# Patient Record
Sex: Female | Born: 1950 | Race: White | Smoking: Never smoker
Health system: Southern US, Community
[De-identification: ages and names within clinical notes are randomized; demographics above are authoritative.]

## PROBLEM LIST (undated history)

## (undated) DIAGNOSIS — E785 Hyperlipidemia, unspecified: Secondary | ICD-10-CM

## (undated) DIAGNOSIS — R011 Cardiac murmur, unspecified: Secondary | ICD-10-CM

## (undated) DIAGNOSIS — I2510 Atherosclerotic heart disease of native coronary artery without angina pectoris: Secondary | ICD-10-CM

## (undated) DIAGNOSIS — I499 Cardiac arrhythmia, unspecified: Secondary | ICD-10-CM

## (undated) DIAGNOSIS — G4733 Obstructive sleep apnea (adult) (pediatric): Secondary | ICD-10-CM

## (undated) DIAGNOSIS — I10 Essential (primary) hypertension: Secondary | ICD-10-CM

## (undated) DIAGNOSIS — F419 Anxiety disorder, unspecified: Secondary | ICD-10-CM

## (undated) HISTORY — DX: Anxiety disorder, unspecified: F41.9

## (undated) HISTORY — DX: Cardiac arrhythmia, unspecified: I49.9

## (undated) HISTORY — DX: Cardiac murmur, unspecified: R01.1

## (undated) HISTORY — DX: Essential (primary) hypertension: I10

## (undated) HISTORY — DX: Obstructive sleep apnea (adult) (pediatric): G47.33

## (undated) HISTORY — PX: SPINAL FUSION: SHX223

## (undated) HISTORY — DX: Hyperlipidemia, unspecified: E78.5

## (undated) HISTORY — DX: Atherosclerotic heart disease of native coronary artery without angina pectoris: I25.10

---

## 1984-04-20 HISTORY — PX: VAGINAL HYSTERECTOMY: SUR661

## 2007-04-21 HISTORY — PX: OTHER SURGICAL HISTORY: SHX169

## 2008-04-20 HISTORY — PX: ACHILLES TENDON SURGERY: SHX542

## 2009-04-20 HISTORY — PX: ROTATOR CUFF REPAIR: SHX139

## 2019-06-30 ENCOUNTER — Other Ambulatory Visit: Payer: Self-pay

## 2019-06-30 ENCOUNTER — Ambulatory Visit: Payer: Medicare Other | Admitting: Cardiology

## 2019-06-30 ENCOUNTER — Encounter: Payer: Self-pay | Admitting: Cardiology

## 2019-06-30 VITALS — BP 150/82 | HR 76 | Temp 99.4°F | Ht 65.0 in | Wt 222.0 lb

## 2019-06-30 DIAGNOSIS — E6609 Other obesity due to excess calories: Secondary | ICD-10-CM

## 2019-06-30 DIAGNOSIS — R0609 Other forms of dyspnea: Secondary | ICD-10-CM

## 2019-06-30 DIAGNOSIS — Z6836 Body mass index (BMI) 36.0-36.9, adult: Secondary | ICD-10-CM

## 2019-06-30 DIAGNOSIS — R011 Cardiac murmur, unspecified: Secondary | ICD-10-CM | POA: Insufficient documentation

## 2019-06-30 DIAGNOSIS — M25569 Pain in unspecified knee: Secondary | ICD-10-CM | POA: Insufficient documentation

## 2019-06-30 DIAGNOSIS — I10 Essential (primary) hypertension: Secondary | ICD-10-CM | POA: Insufficient documentation

## 2019-06-30 DIAGNOSIS — E871 Hypo-osmolality and hyponatremia: Secondary | ICD-10-CM | POA: Insufficient documentation

## 2019-06-30 DIAGNOSIS — Z9660 Presence of unspecified orthopedic joint implant: Secondary | ICD-10-CM | POA: Insufficient documentation

## 2019-06-30 MED ORDER — HYDROCHLOROTHIAZIDE 12.5 MG PO CAPS: 12.5000 mg | ORAL_CAPSULE | Freq: Every day | ORAL | 1 refills | Status: DC

## 2019-06-30 NOTE — Patient Instructions (Signed)
Please remember to bring in your medication bottles in at the next visit.   New Medications that were added at today's visit:  Hydrochlorothiazide 12.5 mg p.o. every morning.  Medications that were discontinued at today's visit: None  Office will call you to have the following tests scheduled:  Echo  Please get labs done in about 1 week at the nearest [facility].  Recommend follow up with your PCP as scheduled.

## 2019-06-30 NOTE — Progress Notes (Signed)
REASON FOR CONSULT: Cardiac murmur.  Chief Complaint  Patient presents with  . Heart Murmur  . New Patient (Initial Visit)    REQUESTING PHYSICIAN:  Frances Maywood, [place] [address]  HPI  Kimberly Lamb is a 69 y.o. female who presents to the office with a chief complaint of " evaluation for heart murmur and shortness of breath." Patient's past medical history and cardiac risk factors include: Benign essential hypertension, obesity, due to excess calories, postmenopausal female, advanced age.  Patient states that she recently went to her primary care provider for evaluation and during that visit she was notified that she had a cardiac murmur.  She was asked to follow-up with cardiology for further evaluation.  Patient states that she has never had a cardiac murmur in the past according to her.  She has not seen a cardiologist in the past and she has never had an echo or stress test either.  She denies any active chest pain.  However she does have effort related dyspnea.  Patient states that he has been going on for at least 1.5 months.  If usually brought on by effort related activities.  It resolves with rest.  Unable to quantify intensity.  She denies any symptoms of heart failure which include orthopnea, paroxysmal nocturnal dyspnea or lower extremity swelling.  She also denies any lightheadedness, dizziness, near syncope or syncopal events with effort related dyspnea.  Benign essential hypertension: Patient has a history of high blood pressure and has been on multiple medications in the past is currently on lisinopril 20 mg p.o. daily.  Patient states that her home blood pressure usually ranges between 892-119 mmHg and diastolic blood pressures range between 70-75 mmHg.  Patient states that she tries to walk at least 2 miles a day in the efforts of losing weight and improving her lipid profile.  Review of systems positive for: Dyspnea on exertion, dysphagia.  Currently patient denies chest pain, lightheadedness, dizziness, palpitations, orthopnea, paroxysmal nocturnal dyspnea, lower extremity swelling, near syncope, syncopal events, hematochezia, hemoptysis, hematemesis, melanotic stools, no symptoms of amaurosis fugax, motor or sensory symptoms or dysphasia in the last 6 months.   Denies prior history of coronary artery disease, myocardial infarction, congestive heart failure, deep venous thrombosis, pulmonary embolism, stroke, transient ischemic attack.  FUNCTIONAL STATUS: She attempts to walk about 22mles a day.    ALLERGIES: Allergies  Allergen Reactions  . Oxycodone-Acetaminophen Other (See Comments)    hallucinate    MEDICATION LIST PRIOR TO VISIT: Current Outpatient Medications on File Prior to Visit  Medication Sig Dispense Refill  . citalopram (CELEXA) 40 MG tablet Take 40 mg by mouth daily.    . cyclobenzaprine (FLEXERIL) 5 MG tablet Take 5 mg by mouth 2 (two) times daily.    .Marland Kitchendocusate sodium (DOK) 100 MG capsule Take 100 mg by mouth as needed.    .Marland Kitchenlisinopril (ZESTRIL) 20 MG tablet Take 20 mg by mouth daily.    . meloxicam (MOBIC) 15 MG tablet Take 15 mg by mouth daily.    . traZODone (DESYREL) 100 MG tablet     . busPIRone (BUSPAR) 15 MG tablet Take 15 mg by mouth as needed.      No current facility-administered medications on file prior to visit.    PAST MEDICAL HISTORY: Past Medical History:  Diagnosis Date  . Anxiety   . Heart murmur   . Hypertension     PAST SURGICAL HISTORY: Past Surgical History:  Procedure Laterality Date  . ACHILLES TENDON SURGERY  2010  . knee replacement Right 2009   x3 2009, 2012, 2014  . ROTATOR CUFF REPAIR  2011   Right  . SPINAL FUSION     l3  . VAGINAL HYSTERECTOMY  1986    FAMILY HISTORY: The patient family history includes Heart attack in her father; Heart failure in her father.   SOCIAL HISTORY:  The patient  reports that she has never smoked. She has never used smokeless  tobacco. She reports that she does not drink alcohol or use drugs.  14 ORGAN REVIEW OF SYSTEMS: CONSTITUTIONAL: No fever or significant weight loss EYES: No recent significant visual change EARS, NOSE, MOUTH, THROAT: No recent significant change in hearing CARDIOVASCULAR: See discussion in subjective/HPI RESPIRATORY: See discussion in subjective/HPI GASTROINTESTINAL: No recent complaints of abdominal pain GENITOURINARY: No recent significant change in genitourinary status MUSCULOSKELETAL: No recent significant change in musculoskeletal status INTEGUMENTARY: No recent rash NEUROLOGIC: No recent significant change in motor function PSYCHIATRIC: No recent significant change in mood ENDOCRINOLOGIC: No recent significant change in endocrine status HEMATOLOGIC/LYMPHATIC: No recent significant unexpected bruising ALLERGIC/IMMUNOLOGIC: No recent unexplained allergic reaction  PHYSICAL EXAM: Vitals with BMI 06/30/2019 06/30/2019  Height - '5\' 5"'$   Weight - 222 lbs  BMI - 71.16  Systolic 579 038  Diastolic 82 89  Pulse 76 81    CONSTITUTIONAL: Well-developed and well-nourished. No acute distress.  SKIN: Skin is warm and dry. No rash noted. No cyanosis. No pallor. No jaundice HEAD: Normocephalic and atraumatic.  EYES: No scleral icterus MOUTH/THROAT: Moist oral membranes.  NECK: No JVD present. No thyromegaly noted. No carotid bruits  LYMPHATIC: No visible cervical adenopathy.  CHEST Normal respiratory effort. No intercostal retractions  LUNGS: Clear to auscultation bilaterally.  No stridor. No wheezes. No rales.  CARDIOVASCULAR: Regular rate and rhythm, positive B3-X8, soft systolic ejection murmur, no gallops or rubs. ABDOMINAL: Soft, nontender, nondistended, positive bowel sounds, no apparent ascites.  EXTREMITIES: No peripheral edema, warm to touch, 2+ dorsalis pedis and posterior tibial pulses bilaterally. HEMATOLOGIC: No significant bruising NEUROLOGIC: Oriented to person, place, and  time. Nonfocal. Normal muscle tone.  PSYCHIATRIC: Normal mood and affect. Normal behavior. Cooperative  CARDIAC DATABASE: EKG: 06/30/2019: Normal sinus rhythm with ventricular rate of 73 bpm, poor R wave progression, nonspecific T wave abnormality, without evidence of underlying injury pattern.  No prior EKGs available for comparison.  Echocardiogram: None  Stress Testing:  None  Heart Catheterization: None  LABORATORY DATA: External Labs: Collected:  Creatinine 0.77 mg/dL. eGFR: Greater than 60 mL/min per 1.73 m Lipid profile: Total cholesterol 208, triglycerides 102, HDL 59, LDL 129 TSH: 1.23   FINAL MEDICATION LIST END OF ENCOUNTER: Meds ordered this encounter  Medications  . hydrochlorothiazide (MICROZIDE) 12.5 MG capsule    Sig: Take 1 capsule (12.5 mg total) by mouth daily.    Dispense:  90 capsule    Refill:  1    Medications Discontinued During This Encounter  Medication Reason  . esomeprazole (NEXIUM) 20 MG capsule Error  . HYDROcodone-acetaminophen (NORCO/VICODIN) 5-325 MG tablet Error  . rivaroxaban (XARELTO) 10 MG TABS tablet Error     Current Outpatient Medications:  .  citalopram (CELEXA) 40 MG tablet, Take 40 mg by mouth daily., Disp: , Rfl:  .  cyclobenzaprine (FLEXERIL) 5 MG tablet, Take 5 mg by mouth 2 (two) times daily., Disp: , Rfl:  .  docusate sodium (DOK) 100 MG capsule, Take 100 mg by mouth as needed., Disp: ,  Rfl:  .  lisinopril (ZESTRIL) 20 MG tablet, Take 20 mg by mouth daily., Disp: , Rfl:  .  meloxicam (MOBIC) 15 MG tablet, Take 15 mg by mouth daily., Disp: , Rfl:  .  traZODone (DESYREL) 100 MG tablet, , Disp: , Rfl:  .  busPIRone (BUSPAR) 15 MG tablet, Take 15 mg by mouth as needed. , Disp: , Rfl:  .  hydrochlorothiazide (MICROZIDE) 12.5 MG capsule, Take 1 capsule (12.5 mg total) by mouth daily., Disp: 90 capsule, Rfl: 1  IMPRESSION:    ICD-10-CM   1. Murmur, cardiac  R01.1 EKG 12-Lead    PCV ECHOCARDIOGRAM COMPLETE  2. Benign  hypertension  I10 hydrochlorothiazide (MICROZIDE) 12.5 MG capsule    Basic metabolic panel    Magnesium    Magnesium    Basic metabolic panel  3. Class 2 obesity due to excess calories without serious comorbidity with body mass index (BMI) of 36.0 to 36.9 in adult  E66.09    Z68.36   4. Dyspnea on exertion  R06.00 hydrochlorothiazide (MICROZIDE) 12.5 MG capsule    Basic metabolic panel    Magnesium    Magnesium    Basic metabolic panel     RECOMMENDATIONS: Kimberly Lamb is a 69 y.o. female whose past medical history and cardiac risk factors include: Benign essential hypertension, obesity, due to excess calories, postmenopausal female, advanced age.  Cardiac murmur:  There appears to be a grade 2-3 out of 6 systolic murmur heard at the second intercostal space.  Plan echocardiogram to evaluate for structural heart disease and LV function.  Benign essential hypertension: . Office blood pressure is not at goal.  . Medication reconciled.  . Add hydrochlorothiazide 12.5 mg p.o. every morning. . Will check renal function and electrolytes 1 week after initiating diuretic therapy. . If the blood pressure is consistently greater than 138mHg patient is asked to call the office to for medication titration sooner than the next office visit.  Low salt diet recommended. A diet that is rich in fruits, vegetables, legumes, and low-fat dairy products and low in snacks, sweets, and meats (such as the Dietary Approaches to Stop Hypertension [DASH] diet).   Mixed hyperlipidemia:  Patient is currently working with her primary provider with lifestyle modifications to improve her overall lipid panel.  Patient is estimated 10-year risk of ASCVD risk is greater than 10%.  We discussed initiating statin therapy if there is not significant improvement in her lipid profile at the next check with your primary provider.  Patient is agreeable.  Obesity, due to excess calories  Body mass index is 36.94  kg/m.  I reviewed with the patient the importance of diet, regular physical activity/exercise, weight loss.    Patient is educated on increasing physical activity gradually as tolerated.  With the goal of moderate intensity exercise for 30 minutes a day 5 days a week.   Orders Placed This Encounter  Procedures  . Basic metabolic panel  . Magnesium  . EKG 12-Lead  . PCV ECHOCARDIOGRAM COMPLETE   --Continue cardiac medications as reconciled in final medication list. --Return in about 6 weeks (around 08/11/2019) for Discussion of test results re: murmur, HTN, meds . Or sooner if needed. --Continue follow-up with your primary care physician regarding the management of your other chronic comorbid conditions.  Patient's questions and concerns were addressed to her satisfaction. She voices understanding of the instructions provided during this encounter.   This note was created using a voice recognition software as a result there  may be grammatical errors inadvertently enclosed that do not reflect the nature of this encounter. Every attempt is made to correct such errors.  Rex Kras, DO, [facility]. [place] Office: [phone]

## 2019-07-06 ENCOUNTER — Ambulatory Visit: Payer: Medicare Other

## 2019-07-06 ENCOUNTER — Other Ambulatory Visit: Payer: Self-pay

## 2019-07-06 DIAGNOSIS — R011 Cardiac murmur, unspecified: Secondary | ICD-10-CM

## 2019-07-08 LAB — BASIC METABOLIC PANEL
BUN/Creatinine Ratio: 30 — ABNORMAL HIGH (ref 12–28)
BUN: 20 mg/dL (ref 8–27)
CO2: 23 mmol/L (ref 20–29)
Calcium: 9.6 mg/dL (ref 8.7–10.3)
Chloride: 102 mmol/L (ref 96–106)
Creatinine, Ser: 0.66 mg/dL (ref 0.57–1.00)
GFR calc Af Amer: 103 mL/min/{1.73_m2} (ref >59)
GFR calc non Af Amer: 89 mL/min/{1.73_m2} (ref >59)
Glucose: 94 mg/dL (ref 65–99)
Potassium: 4.6 mmol/L (ref 3.5–5.2)
Sodium: 139 mmol/L (ref 134–144)

## 2019-07-08 LAB — MAGNESIUM: Magnesium: 2.2 mg/dL (ref 1.6–2.3)

## 2019-07-10 ENCOUNTER — Telehealth: Payer: Self-pay

## 2019-07-11 NOTE — Telephone Encounter (Signed)
Have her increase her lisinopril to 40 mg p.o. every afternoon.Increase hydrochlorothiazide to 25 mg p.o. every morning.Blood work in 1 week check BMP and magnesium level. Please update her MAR and put in lab work.

## 2019-07-12 ENCOUNTER — Other Ambulatory Visit: Payer: Self-pay

## 2019-07-12 DIAGNOSIS — I10 Essential (primary) hypertension: Secondary | ICD-10-CM

## 2019-07-12 NOTE — Telephone Encounter (Signed)
Patient advised to increase Lisinopril to 40mg  p.o. every afternoon and increase Hydrochlorothiazide to 25mg  p.o. every morning, and also to go have blood drawn in a week. She showed understanding and stated that she does not need any refills at this time, because she has enough to last her until then, even with the increase. I will document change in patient chart.

## 2019-07-12 NOTE — Telephone Encounter (Signed)
Were you able to upadate his Med list and or place those labs.

## 2019-07-12 NOTE — Telephone Encounter (Signed)
I already made the changes in patients medication list.

## 2019-07-13 NOTE — Telephone Encounter (Signed)
Okay 

## 2019-07-20 LAB — BASIC METABOLIC PANEL
BUN/Creatinine Ratio: 19 (ref 12–28)
BUN: 16 mg/dL (ref 8–27)
CO2: 22 mmol/L (ref 20–29)
Calcium: 9.6 mg/dL (ref 8.7–10.3)
Chloride: 103 mmol/L (ref 96–106)
Creatinine, Ser: 0.84 mg/dL (ref 0.57–1.00)
GFR calc Af Amer: 81 mL/min/{1.73_m2} (ref >59)
GFR calc non Af Amer: 70 mL/min/{1.73_m2} (ref >59)
Glucose: 94 mg/dL (ref 65–99)
Potassium: 4.1 mmol/L (ref 3.5–5.2)
Sodium: 142 mmol/L (ref 134–144)

## 2019-07-28 ENCOUNTER — Ambulatory Visit: Payer: Medicare Other | Admitting: Cardiology

## 2019-07-28 ENCOUNTER — Other Ambulatory Visit: Payer: Self-pay

## 2019-07-28 ENCOUNTER — Encounter: Payer: Self-pay | Admitting: Cardiology

## 2019-07-28 VITALS — BP 136/65 | HR 75 | Temp 98.3°F | Resp 15 | Ht 66.0 in | Wt 224.0 lb

## 2019-07-28 DIAGNOSIS — E6609 Other obesity due to excess calories: Secondary | ICD-10-CM

## 2019-07-28 DIAGNOSIS — Z6836 Body mass index (BMI) 36.0-36.9, adult: Secondary | ICD-10-CM

## 2019-07-28 DIAGNOSIS — I10 Essential (primary) hypertension: Secondary | ICD-10-CM

## 2019-07-28 DIAGNOSIS — R0609 Other forms of dyspnea: Secondary | ICD-10-CM

## 2019-07-28 MED ORDER — AMLODIPINE BESYLATE 5 MG PO TABS: 5.0000 mg | ORAL_TABLET | Freq: Every morning | ORAL | 1 refills | Status: DC

## 2019-07-28 MED ORDER — LISINOPRIL 40 MG PO TABS: 40.0000 mg | ORAL_TABLET | Freq: Every day | ORAL | 1 refills | Status: DC

## 2019-07-28 MED ORDER — HYDROCHLOROTHIAZIDE 25 MG PO TABS: 25.0000 mg | ORAL_TABLET | Freq: Every morning | ORAL | 1 refills | Status: DC

## 2019-07-28 NOTE — Progress Notes (Signed)
Kimberly Lamb Date of Birth: 08/07/1950 MRN: [id number] Primary Care Provider:[place], [place], FNP  Date: 07/28/19 Last Office Visit: 06/30/2019.  Chief Complaint  Patient presents with  . Follow-up    6 week    HPI  Kimberly Lamb is a 69 y.o. female who presents to the office with a chief complaint of " evaluation for heart murmur and shortness of breath." Patient's past medical history and cardiac risk factors include: Benign essential hypertension, obesity, due to excess calories, postmenopausal female, advanced age.  Patient was initially seen in consultation back in March 2021 at the request of her primary care provider for possible evaluation of a cardiac murmur and irregularly irregular heartbeat.  Since last office visit patient states that she is doing better from a cardiovascular standpoint.    At the last office visit patient was mentioning she has effort related dyspnea that was ongoing for at least 1-1/2 months prior to the original consultation.  Patient states that she feels well with exertion; however, her girlfriends who she goes out for a walk with do mention that she is more short of breath.  Patient states that she tries to walk at least 9500-10,000 steps per day.  She still has shortness of breath but it is not increasing in intensity frequency and/or duration.  Given her symptoms of effort related dyspnea and elevated blood pressures at the last office visit I initiated hydrochlorothiazide 12.5 mg p.o. daily.  Patient called back in the interim stating that her blood pressures continue to be elevated and at that time her lisinopril was increased to 40 mg p.o. daily and HCTZ was increased to 25 mg p.o. daily.  She had blood work 1 week after which notes kidney function and potassium to be at baseline.  Patient brings her blood pressure log in for review and her blood pressures are still not at goal.  They are improving in general.  She no longer has readings as high as 190  mmHg.  Her systolic blood pressures do range between 113--154 mmHg.    Since the last office visit patient has undergone an echocardiogram which reports a preserved left ventricular systolic function indeterminate diastolic filling pattern and mild valvular heart disease.  These findings were discussed with the patient at today's office visit and noted below for further reference.  Review of systems positive for: Dyspnea on exertion. Currently patient denies chest pain, lightheadedness, dizziness, palpitations, orthopnea, paroxysmal nocturnal dyspnea, lower extremity swelling, near syncope, syncopal events, hematochezia, hemoptysis, hematemesis, melanotic stools, no symptoms of amaurosis fugax, motor or sensory symptoms or dysphasia in the last 6 months.   Denies prior history of coronary artery disease, myocardial infarction, congestive heart failure, deep venous thrombosis, pulmonary embolism, stroke, transient ischemic attack.  FUNCTIONAL STATUS: She walks approximately 9,500-10000 steps per day..    ALLERGIES: No Active Allergies MEDICATION LIST PRIOR TO VISIT: Current Outpatient Medications on File Prior to Visit  Medication Sig Dispense Refill  . busPIRone (BUSPAR) 15 MG tablet Take 15 mg by mouth 2 (two) times daily.     . citalopram (CELEXA) 40 MG tablet Take 40 mg by mouth daily.    . cyclobenzaprine (FLEXERIL) 5 MG tablet Take 5 mg by mouth 2 (two) times daily.    Marland Kitchen docusate sodium (DOK) 100 MG capsule Take 100 mg by mouth as needed.    . meloxicam (MOBIC) 15 MG tablet Take 15 mg by mouth daily.    . traZODone (DESYREL) 100 MG tablet  No current facility-administered medications on file prior to visit.    PAST MEDICAL HISTORY: Past Medical History:  Diagnosis Date  . Anxiety   . Heart murmur   . Hypertension     PAST SURGICAL HISTORY: Past Surgical History:  Procedure Laterality Date  . ACHILLES TENDON SURGERY  2010  . knee replacement Right 2009   x3 2009, 2012,  2014  . ROTATOR CUFF REPAIR  2011   Right  . SPINAL FUSION     l3  . VAGINAL HYSTERECTOMY  1986    FAMILY HISTORY: The patient family history includes Heart attack in her father; Heart failure in her father.   SOCIAL HISTORY:  The patient  reports that she has never smoked. She has never used smokeless tobacco. She reports that she does not drink alcohol or use drugs.  14 ORGAN REVIEW OF SYSTEMS: CONSTITUTIONAL: No fever or significant weight loss EYES: No recent significant visual change EARS, NOSE, MOUTH, THROAT: No recent significant change in hearing CARDIOVASCULAR: See discussion in subjective/HPI RESPIRATORY: See discussion in subjective/HPI GASTROINTESTINAL: No recent complaints of abdominal pain GENITOURINARY: No recent significant change in genitourinary status MUSCULOSKELETAL: No recent significant change in musculoskeletal status INTEGUMENTARY: No recent rash NEUROLOGIC: No recent significant change in motor function PSYCHIATRIC: No recent significant change in mood ENDOCRINOLOGIC: No recent significant change in endocrine status HEMATOLOGIC/LYMPHATIC: No recent significant unexpected bruising ALLERGIC/IMMUNOLOGIC: No recent unexplained allergic reaction  PHYSICAL EXAM: Vitals with BMI 07/28/2019 07/28/2019 06/30/2019  Height - _0  -  Weight - 224 lbs -  BMI - 53.74 -  Systolic 827 078 675  Diastolic 65 65 82  Pulse - 75 76    CONSTITUTIONAL: Well-developed and well-nourished. No acute distress.  SKIN: Skin is warm and dry. No rash noted. No cyanosis. No pallor. No jaundice HEAD: Normocephalic and atraumatic.  EYES: No scleral icterus MOUTH/THROAT: Moist oral membranes.  NECK: No JVD present. No thyromegaly noted. No carotid bruits  LYMPHATIC: No visible cervical adenopathy.  CHEST Normal respiratory effort. No intercostal retractions  LUNGS: Clear to auscultation bilaterally.  No stridor. No wheezes. No rales.  CARDIOVASCULAR: Regular rate and rhythm,  positive Q4-B2, soft systolic ejection murmur, no gallops or rubs. ABDOMINAL: Soft, nontender, nondistended, positive bowel sounds, no apparent ascites.  EXTREMITIES: No peripheral edema, warm to touch, 2+ dorsalis pedis and posterior tibial pulses bilaterally. HEMATOLOGIC: No significant bruising NEUROLOGIC: Oriented to person, place, and time. Nonfocal. Normal muscle tone.  PSYCHIATRIC: Normal mood and affect. Normal behavior. Cooperative  CARDIAC DATABASE: EKG: 06/30/2019: Normal sinus rhythm with ventricular rate of 73 bpm, poor R wave progression, nonspecific T wave abnormality, without evidence of underlying injury pattern.  No prior EKGs available for comparison.  Echocardiogram: 07/06/2019: LVEF 55-60%, moderate left ventricular hypertrophy, indeterminate diastolic filling pattern, mild to moderately dilated left atrium, mild MAC, mild MR, mild TR.  Stress Testing:  None  Heart Catheterization: None  LABORATORY DATA: External Labs: Collected:  Creatinine 0.77 mg/dL. eGFR: Greater than 60 mL/min per 1.73 m Lipid profile: Total cholesterol 208, triglycerides 102, HDL 59, LDL 129 TSH: 1.23   FINAL MEDICATION LIST END OF ENCOUNTER: Meds ordered this encounter  Medications  . amLODipine (NORVASC) 5 MG tablet    Sig: Take 1 tablet (5 mg total) by mouth in the morning.    Dispense:  90 tablet    Refill:  1  . lisinopril (ZESTRIL) 40 MG tablet    Sig: Take 1 tablet (40 mg total) by mouth daily.  Dispense:  90 tablet    Refill:  1  . hydrochlorothiazide (HYDRODIURIL) 25 MG tablet    Sig: Take 1 tablet (25 mg total) by mouth in the morning.    Dispense:  90 tablet    Refill:  1    Medications Discontinued During This Encounter  Medication Reason  . hydrochlorothiazide (HYDRODIURIL) 25 MG tablet Reorder  . lisinopril (ZESTRIL) 40 MG tablet Reorder     Current Outpatient Medications:  .  busPIRone (BUSPAR) 15 MG tablet, Take 15 mg by mouth 2 (two) times daily. , Disp:  , Rfl:  .  citalopram (CELEXA) 40 MG tablet, Take 40 mg by mouth daily., Disp: , Rfl:  .  cyclobenzaprine (FLEXERIL) 5 MG tablet, Take 5 mg by mouth 2 (two) times daily., Disp: , Rfl:  .  docusate sodium (DOK) 100 MG capsule, Take 100 mg by mouth as needed., Disp: , Rfl:  .  hydrochlorothiazide (HYDRODIURIL) 25 MG tablet, Take 1 tablet (25 mg total) by mouth in the morning., Disp: 90 tablet, Rfl: 1 .  lisinopril (ZESTRIL) 40 MG tablet, Take 1 tablet (40 mg total) by mouth daily., Disp: 90 tablet, Rfl: 1 .  meloxicam (MOBIC) 15 MG tablet, Take 15 mg by mouth daily., Disp: , Rfl:  .  traZODone (DESYREL) 100 MG tablet, , Disp: , Rfl:  .  amLODipine (NORVASC) 5 MG tablet, Take 1 tablet (5 mg total) by mouth in the morning., Disp: 90 tablet, Rfl: 1  IMPRESSION:    ICD-10-CM   1. Dyspnea on exertion  R06.00 amLODipine (NORVASC) 5 MG tablet    HOLTER MONITOR - 48 HOUR    lisinopril (ZESTRIL) 40 MG tablet    hydrochlorothiazide (HYDRODIURIL) 25 MG tablet    PCV MYOCARDIAL PERFUSION WITH LEXISCAN  2. Benign hypertension  I10 amLODipine (NORVASC) 5 MG tablet    lisinopril (ZESTRIL) 40 MG tablet    hydrochlorothiazide (HYDRODIURIL) 25 MG tablet  3. Class 2 obesity due to excess calories without serious comorbidity with body mass index (BMI) of 36.0 to 36.9 in adult  E66.09    Z68.36      RECOMMENDATIONS: Kimberly Lamb is a 69 y.o. female whose past medical history and cardiac risk factors include: Benign essential hypertension, obesity, due to excess calories, postmenopausal female, advanced age.  Dyspnea on exertion:  Initially thought that a component of her effort related dyspnea was secondary to elevated blood pressures.  However, despite improving blood pressures she continues to have shortness of breath.  She has multiple cardiovascular risk factors I would recommend an ischemic evaluation.  Nuclear stress test recommended to evaluate for reversible ischemia.  Echocardiogram results  reviewed with the patient.  She is noted to have a preserved left ventricular systolic function.  But due to dilated left atrium would like to recommend a 48-hour Holter to possibly evaluate for underlying atrial fibrillation and/or arrhythmic burden.  Benign essential hypertension: . Medication reconciled.  . Add Norvasc 5 mg p.o. every morning. Marland Kitchen Refill hydrochlorothiazide and lisinopril. . Patient was recommended to enroll into principal care management for better blood pressure monitoring.  However, patient would like to hold off on it and may consider at the next office visit. . If the blood pressure is consistently greater than 112mHg patient is asked to call the office to for medication titration sooner than the next office visit.  . Low salt diet recommended. A diet that is rich in fruits, vegetables, legumes, and low-fat dairy products and low in snacks,  sweets, and meats (such as the Dietary Approaches to Stop Hypertension [DASH] diet).   Mixed hyperlipidemia:  Patient is currently working with her primary provider with lifestyle modifications to improve her overall lipid panel.  Patient is estimated 10-year risk of ASCVD risk is greater than 10%.  We discussed initiating statin therapy if there is not significant improvement in her lipid profile at the next check with your primary provider.  Patient is agreeable.  Obesity, due to excess calories Body mass index is 36.15 kg/m.  I reviewed with the patient the importance of diet, regular physical activity/exercise, weight loss.    Patient is educated on increasing physical activity gradually as tolerated.  With the goal of moderate intensity exercise for 30 minutes a day 5 days a week.   Orders Placed This Encounter  Procedures  . HOLTER MONITOR - 48 HOUR  . PCV MYOCARDIAL PERFUSION WITH LEXISCAN   --Continue cardiac medications as reconciled in final medication list. --Return in about 6 weeks (around 09/08/2019). Or sooner if  needed. --Continue follow-up with your primary care physician regarding the management of your other chronic comorbid conditions.  Patient's questions and concerns were addressed to her satisfaction. She voices understanding of the instructions provided during this encounter.   This note was created using a voice recognition software as a result there may be grammatical errors inadvertently enclosed that do not reflect the nature of this encounter. Every attempt is made to correct such errors.  Rex Kras, DO, [facility]. [place] Office: [phone]

## 2019-08-09 ENCOUNTER — Other Ambulatory Visit: Payer: Self-pay

## 2019-08-09 ENCOUNTER — Ambulatory Visit: Payer: Medicare Other

## 2019-08-09 DIAGNOSIS — R0609 Other forms of dyspnea: Secondary | ICD-10-CM

## 2019-08-15 ENCOUNTER — Telehealth: Payer: Self-pay

## 2019-08-15 NOTE — Telephone Encounter (Signed)
Patient has follow up appt tomorrow on 4/28.

## 2019-08-15 NOTE — Telephone Encounter (Signed)
-----   Message from [place], [place] sent at 08/13/2019 10:55 PM EDT ----- Please have her come in this week to review her stress test results.  If she continue to have symptoms that have increased in intensity, frequency, duration or new symptoms suggestive of typical chest pain as discussed during last office visit please still seek medical attention at the closest ER via EMS.

## 2019-08-15 NOTE — Telephone Encounter (Signed)
-----   Message from Sunit Tolia, DO sent at 08/13/2019 10:55 PM EDT ----- Please have her come in this week to review her stress test results.  If she continue to have symptoms that have increased in intensity, frequency, duration or new symptoms suggestive of typical chest pain as discussed during last office visit please still seek medical attention at the closest ER via EMS. 

## 2019-08-16 ENCOUNTER — Ambulatory Visit: Payer: Medicare Other | Admitting: Cardiology

## 2019-08-16 ENCOUNTER — Encounter: Payer: Self-pay | Admitting: Cardiology

## 2019-08-16 ENCOUNTER — Other Ambulatory Visit: Payer: Self-pay

## 2019-08-16 VITALS — BP 139/73 | HR 70 | Temp 98.7°F | Ht 66.0 in | Wt 216.0 lb

## 2019-08-16 DIAGNOSIS — I10 Essential (primary) hypertension: Secondary | ICD-10-CM

## 2019-08-16 DIAGNOSIS — E6609 Other obesity due to excess calories: Secondary | ICD-10-CM

## 2019-08-16 DIAGNOSIS — Z6836 Body mass index (BMI) 36.0-36.9, adult: Secondary | ICD-10-CM

## 2019-08-16 DIAGNOSIS — R9430 Abnormal result of cardiovascular function study, unspecified: Secondary | ICD-10-CM

## 2019-08-16 DIAGNOSIS — R0609 Other forms of dyspnea: Secondary | ICD-10-CM

## 2019-08-16 MED ORDER — NITROGLYCERIN 0.4 MG SL SUBL: 0.4000 mg | SUBLINGUAL_TABLET | SUBLINGUAL | 3 refills | Status: DC | PRN

## 2019-08-16 MED ORDER — METOPROLOL TARTRATE 25 MG PO TABS: 25.0000 mg | ORAL_TABLET | Freq: Two times a day (BID) | ORAL | 0 refills | Status: DC

## 2019-08-16 NOTE — Progress Notes (Signed)
Kimberly Lamb Date of Birth: Jul 13, 1950 MRN: [id number] Primary Care Provider:[place], Council Mechanic, FNP Primary Cardiologist: Rex Kras, DO (established care 06/30/2019)  Date: 08/16/19 Last Office Visit: 07/28/2019  Chief Complaint  Patient presents with  . Shortness of Breath    follow up  . Hypertension   HPI  Kimberly Lamb is a 70 y.o. female who presents to the office with a chief complaint of " shortness of breath and hypertension." Patient's past medical history and cardiac risk factors include: Benign essential hypertension, obesity, due to excess calories, postmenopausal female, advanced age.  Patient was initially seen in consultation back in March 2021 at the request of her primary care provider for possible evaluation of a cardiac murmur and irregularly irregular heartbeat.    Initially patient's shortness of breath was most likely secondary to uncontrolled hypertension.  Her antibiotics medications have been uptitrated and her systolic blood pressures have improved significantly since last office visit.  Her blood pressure log was reviewed at today's office visit and her systolic blood pressures are consistently less than 130 mmHg.  She tolerated the addition of amlodipine 5 mg every morning well.  At the last office visit since patient was continued to have effort related dyspnea despite improvement in her blood pressures we decided to pursue an ischemic evaluation.  Patient underwent a nuclear stress test which was reported to be abnormal with perfusion defect suggestive of reversible ischemia in the LCx distribution.  I independently reviewed the nuclear stress test with both the patient and her daughter at today's office visit.  We decided to pursue additional work-up given her continued symptoms of effort related dyspnea.  We discussed cardiac CTA with FFR versus left heart catheterization.  Patient would like to proceed with cardiac CTA FFR for further evaluation of obstructive  coronary disease.  Of note patient has modified her functional capacity to avoid symptoms of effort related dyspnea.  ALLERGIES: No Active Allergies MEDICATION LIST PRIOR TO VISIT: Current Outpatient Medications on File Prior to Visit  Medication Sig Dispense Refill  . amLODipine (NORVASC) 5 MG tablet Take 1 tablet (5 mg total) by mouth in the morning. 90 tablet 1  . citalopram (CELEXA) 40 MG tablet Take 40 mg by mouth daily.    . cyclobenzaprine (FLEXERIL) 5 MG tablet Take 5 mg by mouth 2 (two) times daily.    Marland Kitchen docusate sodium (DOK) 100 MG capsule Take 100 mg by mouth as needed.    . hydrochlorothiazide (HYDRODIURIL) 25 MG tablet Take 1 tablet (25 mg total) by mouth in the morning. 90 tablet 1  . lisinopril (ZESTRIL) 40 MG tablet Take 1 tablet (40 mg total) by mouth daily. 90 tablet 1  . meloxicam (MOBIC) 15 MG tablet Take 15 mg by mouth daily.    . traZODone (DESYREL) 100 MG tablet      No current facility-administered medications on file prior to visit.    PAST MEDICAL HISTORY: Past Medical History:  Diagnosis Date  . Anxiety   . Heart murmur   . Hypertension     PAST SURGICAL HISTORY: Past Surgical History:  Procedure Laterality Date  . ACHILLES TENDON SURGERY  2010  . knee replacement Right 2009   x3 2009, 2012, 2014  . ROTATOR CUFF REPAIR  2011   Right  . SPINAL FUSION     l3  . VAGINAL HYSTERECTOMY  1986    FAMILY HISTORY: The patient family history includes Heart attack in her father; Heart failure in her father.  SOCIAL HISTORY:  The patient  reports that she has never smoked. She has never used smokeless tobacco. She reports that she does not drink alcohol or use drugs.  Review of Systems  Constitution: Negative for chills and fever.  HENT: Negative for hoarse voice and nosebleeds.   Eyes: Negative for discharge, double vision and pain.  Cardiovascular: Positive for dyspnea on exertion. Negative for chest pain, claudication, leg swelling, near-syncope,  orthopnea, palpitations, paroxysmal nocturnal dyspnea and syncope.  Respiratory: Negative for hemoptysis and shortness of breath.   Musculoskeletal: Negative for muscle cramps and myalgias.  Gastrointestinal: Negative for abdominal pain, constipation, diarrhea, hematemesis, hematochezia, melena, nausea and vomiting.  Neurological: Negative for dizziness and light-headedness.   PHYSICAL EXAM: Vitals with BMI 08/16/2019 07/28/2019 07/28/2019  Height _0  - _1   Weight 216 lbs - 224 lbs  BMI 55.73 - 22.02  Systolic 542 706 237  Diastolic 73 65 65  Pulse 70 - 75   CONSTITUTIONAL: Well-developed and well-nourished. No acute distress.  SKIN: Skin is warm and dry. No rash noted. No cyanosis. No pallor. No jaundice HEAD: Normocephalic and atraumatic.  EYES: No scleral icterus MOUTH/THROAT: Moist oral membranes.  NECK: No JVD present. No thyromegaly noted. No carotid bruits  LYMPHATIC: No visible cervical adenopathy.  CHEST Normal respiratory effort. No intercostal retractions  LUNGS: Clear to auscultation bilaterally.  No stridor. No wheezes. No rales.  CARDIOVASCULAR: Regular rate and rhythm, positive S2-G3, soft systolic ejection murmur, no gallops or rubs. ABDOMINAL: Soft, nontender, nondistended, positive bowel sounds, no apparent ascites.  EXTREMITIES: No peripheral edema, warm to touch, 2+ dorsalis pedis and posterior tibial pulses bilaterally. HEMATOLOGIC: No significant bruising NEUROLOGIC: Oriented to person, place, and time. Nonfocal. Normal muscle tone.  PSYCHIATRIC: Normal mood and affect. Normal behavior. Cooperative  CARDIAC DATABASE: EKG: 06/30/2019: Normal sinus rhythm with ventricular rate of 73 bpm, poor R wave progression, nonspecific T wave abnormality, without evidence of underlying injury pattern.  No prior EKGs available for comparison.  Echocardiogram: 07/06/2019: LVEF 55-60%, moderate left ventricular hypertrophy, indeterminate diastolic filling pattern, mild to  moderately dilated left atrium, mild MAC, mild MR, mild TR.  Stress Testing:  Carlton Adam (Walking with mod Bruce)Tetrofosmin Stress Test  08/09/2019: Mild degree Moderate extent perfusion defect consistent with mild (reversible) ischemia located in the mid to distal lateral wall (Left Circumflex Artery region) of left ventricle. Overall LV systolic function is normal without regional wall motion abnormalities. Stress LV EF: 75%. Low risk.  Heart Catheterization: None  LABORATORY DATA: External Labs: Collected:  Creatinine 0.77 mg/dL. eGFR: Greater than 60 mL/min per 1.73 m Lipid profile: Total cholesterol 208, triglycerides 102, HDL 59, LDL 129 TSH: 1.23   FINAL MEDICATION LIST END OF ENCOUNTER: Meds ordered this encounter  Medications  . metoprolol tartrate (LOPRESSOR) 25 MG tablet    Sig: Take 1 tablet (25 mg total) by mouth 2 (two) times daily for 8 days.    Dispense:  8 tablet    Refill:  0  . nitroGLYCERIN (NITROSTAT) 0.4 MG SL tablet    Sig: Place 1 tablet (0.4 mg total) under the tongue every 5 (five) minutes as needed for chest pain.    Dispense:  90 tablet    Refill:  3    Medications Discontinued During This Encounter  Medication Reason  . busPIRone (BUSPAR) 15 MG tablet Patient Discharge     Current Outpatient Medications:  .  amLODipine (NORVASC) 5 MG tablet, Take 1 tablet (5 mg total) by mouth in the  morning., Disp: 90 tablet, Rfl: 1 .  citalopram (CELEXA) 40 MG tablet, Take 40 mg by mouth daily., Disp: , Rfl:  .  cyclobenzaprine (FLEXERIL) 5 MG tablet, Take 5 mg by mouth 2 (two) times daily., Disp: , Rfl:  .  docusate sodium (DOK) 100 MG capsule, Take 100 mg by mouth as needed., Disp: , Rfl:  .  hydrochlorothiazide (HYDRODIURIL) 25 MG tablet, Take 1 tablet (25 mg total) by mouth in the morning., Disp: 90 tablet, Rfl: 1 .  lisinopril (ZESTRIL) 40 MG tablet, Take 1 tablet (40 mg total) by mouth daily., Disp: 90 tablet, Rfl: 1 .  meloxicam (MOBIC) 15 MG tablet, Take  15 mg by mouth daily., Disp: , Rfl:  .  traZODone (DESYREL) 100 MG tablet, , Disp: , Rfl:  .  metoprolol tartrate (LOPRESSOR) 25 MG tablet, Take 1 tablet (25 mg total) by mouth 2 (two) times daily for 8 days., Disp: 8 tablet, Rfl: 0 .  nitroGLYCERIN (NITROSTAT) 0.4 MG SL tablet, Place 1 tablet (0.4 mg total) under the tongue every 5 (five) minutes as needed for chest pain., Disp: 90 tablet, Rfl: 3  IMPRESSION:    ICD-10-CM   1. Abnormal nuclear stress test  R94.39 CT CORONARY MORPH W/CTA COR W/SCORE W/CA W/CM &/OR WO/CM    CT CORONARY FRACTIONAL FLOW RESERVE DATA PREP    CT CORONARY FRACTIONAL FLOW RESERVE FLUID ANALYSIS    metoprolol tartrate (LOPRESSOR) 25 MG tablet    nitroGLYCERIN (NITROSTAT) 0.4 MG SL tablet  2. Dyspnea on exertion  R06.00 CT CORONARY MORPH W/CTA COR W/SCORE W/CA W/CM &/OR WO/CM    CT CORONARY FRACTIONAL FLOW RESERVE DATA PREP    CT CORONARY FRACTIONAL FLOW RESERVE FLUID ANALYSIS    nitroGLYCERIN (NITROSTAT) 0.4 MG SL tablet  3. Benign hypertension  I10   4. Class 2 obesity due to excess calories without serious comorbidity with body mass index (BMI) of 36.0 to 36.9 in adult  E66.09    Z68.36      RECOMMENDATIONS: Kimberly Lamb is a 69 y.o. female whose past medical history and cardiac risk factors include: Benign essential hypertension, obesity, due to excess calories, postmenopausal female, advanced age.  Abnormal nuclear stress test:  I independently reviewed the nuclear stress test images with the patient and agree with the findings as noted above.  She does appear to have a reversible perfusion defect in the LCx distribution.  We discussed cardiac CTA versus invasive angiography.  Patient would like to proceed with cardiac CTA.    Sublingual nitroglycerin tablets prescribed to use for as needed basis.  Medication profile discussed with the patient.    Patient will be scheduled for cardiac CTA with FFR given the abnormal nuclear stress test and effort related  dyspnea.  Also spoke to the cardiac CTA coordinator at [facility] health to coordinate her study.  Dyspnea on exertion:  Initially thought that a component of her effort related dyspnea was secondary to elevated blood pressures.  However, despite improving blood pressures she continues to have shortness of breath.  She has multiple cardiovascular risk factors I recommended an ischemic evaluation.  Reviewed nuclear stress test images with the patient.  See above  Patient is educated to seek medical attention at the closest ER via EMS if her symptoms increase in intensity, frequency, duration or has typical chest pain as discussed in the office.   Benign essential hypertension: Well-controlled. . Medication reconciled.  . Low salt diet recommended. A diet that is rich in fruits,  vegetables, legumes, and low-fat dairy products and low in snacks, sweets, and meats (such as the Dietary Approaches to Stop Hypertension [DASH] diet).   Mixed hyperlipidemia:  Patient is currently working with her primary provider with lifestyle modifications to improve her overall lipid panel.  Obesity, due to excess calories Body mass index is 34.86 kg/m.  I reviewed with the patient the importance of diet, regular physical activity/exercise, weight loss.    Patient is educated on increasing physical activity gradually as tolerated.  With the goal of moderate intensity exercise for 30 minutes a day 5 days a week.  Total encounter time 46 minutes. *Total Encounter Time as defined by the Centers for Medicare and Medicaid Services includes, in addition to the face-to-face time of a patient visit (documented in the note above) non-face-to-face time: obtaining and reviewing outside history, ordering and reviewing medications, tests or procedures, care coordination (communications with other health care professionals or caregivers) and documentation in the medical record.  Orders Placed This Encounter  Procedures  . CT  CORONARY MORPH W/CTA COR W/SCORE W/CA W/CM &/OR WO/CM  . CT CORONARY FRACTIONAL FLOW RESERVE DATA PREP  . CT CORONARY FRACTIONAL FLOW RESERVE FLUID ANALYSIS   --Continue cardiac medications as reconciled in final medication list. --Return in about 4 weeks (around 09/13/2019). Or sooner if needed. --Continue follow-up with your primary care physician regarding the management of your other chronic comorbid conditions.  Patient's questions and concerns were addressed to her satisfaction. She voices understanding of the instructions provided during this encounter.   This note was created using a voice recognition software as a result there may be grammatical errors inadvertently enclosed that do not reflect the nature of this encounter. Every attempt is made to correct such errors.  Rex Kras, DO, [facility]. [place] Office: [phone]

## 2019-08-17 ENCOUNTER — Other Ambulatory Visit: Payer: Self-pay | Admitting: Cardiology

## 2019-08-17 DIAGNOSIS — R0609 Other forms of dyspnea: Secondary | ICD-10-CM

## 2019-08-18 LAB — BASIC METABOLIC PANEL
BUN/Creatinine Ratio: 23 (ref 12–28)
BUN: 17 mg/dL (ref 8–27)
CO2: 23 mmol/L (ref 20–29)
Calcium: 9.4 mg/dL (ref 8.7–10.3)
Chloride: 102 mmol/L (ref 96–106)
Creatinine, Ser: 0.74 mg/dL (ref 0.57–1.00)
GFR calc Af Amer: 94 mL/min/{1.73_m2} (ref >59)
GFR calc non Af Amer: 82 mL/min/{1.73_m2} (ref >59)
Glucose: 100 mg/dL — ABNORMAL HIGH (ref 65–99)
Potassium: 4.3 mmol/L (ref 3.5–5.2)
Sodium: 142 mmol/L (ref 134–144)

## 2019-08-23 ENCOUNTER — Encounter (HOSPITAL_COMMUNITY): Payer: Self-pay

## 2019-08-23 ENCOUNTER — Telehealth (HOSPITAL_COMMUNITY): Payer: Self-pay | Admitting: Emergency Medicine

## 2019-08-23 NOTE — Telephone Encounter (Signed)
Reaching out to patient to offer assistance regarding upcoming cardiac imaging study; pt verbalizes understanding of appt date/time, parking situation and where to check in, pre-test NPO status and medications ordered, and verified current allergies; name and call back number provided for further questions should they arise Rockwell Alexandria RN Navigator Cardiac Imaging [facility] [phone] office [phone] cell   Pt instructed to hold daily HCTZ

## 2019-08-24 ENCOUNTER — Other Ambulatory Visit: Payer: Self-pay

## 2019-08-24 ENCOUNTER — Ambulatory Visit (HOSPITAL_COMMUNITY)
Admission: RE | Admit: 2019-08-24 | Discharge: 2019-08-24 | Disposition: A | Discharge status: Home / Self Care | Payer: Medicare Other | Source: Ambulatory Visit | Attending: Cardiology | Admitting: Cardiology

## 2019-08-24 DIAGNOSIS — R0609 Other forms of dyspnea: Secondary | ICD-10-CM

## 2019-08-24 DIAGNOSIS — R0600 Dyspnea, unspecified: Secondary | ICD-10-CM | POA: Insufficient documentation

## 2019-08-24 DIAGNOSIS — R9430 Abnormal result of cardiovascular function study, unspecified: Secondary | ICD-10-CM | POA: Diagnosis present

## 2019-08-24 MED ORDER — NITROGLYCERIN 0.4 MG SL SUBL
0.8000 mg | SUBLINGUAL_TABLET | Freq: Once | SUBLINGUAL | Status: AC
  Administered 2019-08-24: 13:00:00 0.8 mg via SUBLINGUAL

## 2019-08-24 MED ORDER — IOHEXOL 350 MG/ML SOLN
80.0000 mL | Freq: Once | INTRAVENOUS | Status: AC | PRN
  Administered 2019-08-24: 14:00:00 80 mL via INTRAVENOUS

## 2019-08-24 MED ORDER — NITROGLYCERIN 0.4 MG SL SUBL
SUBLINGUAL_TABLET | SUBLINGUAL | Status: AC
  Filled 2019-08-24: qty 2

## 2019-08-24 NOTE — Progress Notes (Signed)
CT scan completed. Tolerated well. D/C home ambulatory with daughter. Awake and alert. In no distress 

## 2019-08-25 ENCOUNTER — Telehealth: Payer: Self-pay

## 2019-08-25 NOTE — Telephone Encounter (Signed)
Left detailed vm for patient.

## 2019-09-12 ENCOUNTER — Ambulatory Visit: Payer: Medicare Other | Admitting: Cardiology

## 2019-09-12 ENCOUNTER — Encounter: Payer: Self-pay | Admitting: Cardiology

## 2019-09-12 ENCOUNTER — Other Ambulatory Visit: Payer: Self-pay

## 2019-09-12 ENCOUNTER — Other Ambulatory Visit: Payer: Self-pay | Admitting: Cardiology

## 2019-09-12 VITALS — BP 166/75 | HR 70 | Ht 66.0 in | Wt 220.9 lb

## 2019-09-12 DIAGNOSIS — I2510 Atherosclerotic heart disease of native coronary artery without angina pectoris: Secondary | ICD-10-CM

## 2019-09-12 DIAGNOSIS — I10 Essential (primary) hypertension: Secondary | ICD-10-CM

## 2019-09-12 DIAGNOSIS — Z712 Person consulting for explanation of examination or test findings: Secondary | ICD-10-CM

## 2019-09-12 DIAGNOSIS — E6609 Other obesity due to excess calories: Secondary | ICD-10-CM

## 2019-09-12 DIAGNOSIS — R9430 Abnormal result of cardiovascular function study, unspecified: Secondary | ICD-10-CM

## 2019-09-12 DIAGNOSIS — R0609 Other forms of dyspnea: Secondary | ICD-10-CM

## 2019-09-12 MED ORDER — METOPROLOL SUCCINATE ER 50 MG PO TB24: 50.0000 mg | ORAL_TABLET | Freq: Every day | ORAL | 0 refills | Status: DC

## 2019-09-12 MED ORDER — AMLODIPINE BESYLATE 10 MG PO TABS: 10.0000 mg | ORAL_TABLET | Freq: Every day | ORAL | 3 refills | Status: DC

## 2019-09-12 MED ORDER — ATORVASTATIN CALCIUM 20 MG PO TABS: 20.0000 mg | ORAL_TABLET | Freq: Every day | ORAL | 0 refills | Status: DC

## 2019-09-12 NOTE — Progress Notes (Signed)
Kimberly Lamb Date of Birth: April 21, 1950 MRN: [id number] Primary Care Provider:[place], Council Mechanic, FNP Primary Cardiologist: Rex Kras, DO (established care 06/30/2019)  Date: 09/12/19 Last Office Visit: 08/16/2019  Chief Complaint  Patient presents with  . Follow-up    6 week  . Shortness of Breath   HPI  Kimberly Lamb is a 69 y.o. female who presents to the office with a chief complaint of " dyspnea on exertion and follow-up test results." Patient's past medical history and cardiac risk factors include: Coronary artery calcification, nonobstructive CAD, benign essential hypertension, obesity, due to excess calories, postmenopausal female, advanced age.  Patient was initially seen in consultation back in March 2021 at the request of her primary care provider for possible evaluation of a cardiac murmur.    During initial office visit patient states that she was having effort related dyspnea that was more noticeable with her friends while they went out on walks.  It was initially thought that her effort related dyspnea was most likely secondary to uncontrolled hypertension.  However, during ischemic evaluation patient was noted to have an abnormal nuclear stress test.  Nuclear stress test suggested reversible ischemia in the LCx distribution and shared decision was to proceed with coronary CTA to evaluate for obstructive coronary disease given his symptoms and recent work-up.  Since last office visit patient underwent cardiac CTA and was found to have nonobstructive coronary disease as mentioned below.   Patient states that his shortness of breath with effort related activities is improving but not resolved.  She has been keeping a log of her blood pressures and her systolic blood pressures range between 356-701 mmHg and diastolic blood pressures are less than 80 mmHg.  Medications reconciled.  ALLERGIES: No Active Allergies  MEDICATION LIST PRIOR TO VISIT: Current Outpatient Medications on  File Prior to Visit  Medication Sig Dispense Refill  . aspirin 81 MG EC tablet Take 81 mg by mouth daily. Swallow whole.    . citalopram (CELEXA) 40 MG tablet Take 40 mg by mouth daily.    . cyclobenzaprine (FLEXERIL) 5 MG tablet Take 5 mg by mouth 2 (two) times daily.    Marland Kitchen docusate sodium (DOK) 100 MG capsule Take 100 mg by mouth as needed.    . hydrochlorothiazide (HYDRODIURIL) 25 MG tablet Take 1 tablet (25 mg total) by mouth in the morning. 90 tablet 1  . lisinopril (ZESTRIL) 40 MG tablet Take 1 tablet (40 mg total) by mouth daily. 90 tablet 1  . meloxicam (MOBIC) 15 MG tablet Take 15 mg by mouth daily.    . nitroGLYCERIN (NITROSTAT) 0.4 MG SL tablet Place 1 tablet (0.4 mg total) under the tongue every 5 (five) minutes as needed for chest pain. 90 tablet 3  . traZODone (DESYREL) 100 MG tablet      No current facility-administered medications on file prior to visit.    PAST MEDICAL HISTORY: Past Medical History:  Diagnosis Date  . Anxiety   . Coronary artery calcification   . Heart murmur   . Hyperlipidemia   . Hypertension   . Nonobstructive atherosclerosis of coronary artery     PAST SURGICAL HISTORY: Past Surgical History:  Procedure Laterality Date  . ACHILLES TENDON SURGERY  2010  . knee replacement Right 2009   x3 2009, 2012, 2014  . ROTATOR CUFF REPAIR  2011   Right  . SPINAL FUSION     l3  . VAGINAL HYSTERECTOMY  1986    FAMILY HISTORY: The patient family history includes  Heart attack in her father; Heart failure in her father.   SOCIAL HISTORY:  The patient  reports that she has never smoked. She has never used smokeless tobacco. She reports that she does not drink alcohol or use drugs.  Review of Systems  Constitution: Negative for chills and fever.  HENT: Negative for hoarse voice and nosebleeds.   Eyes: Negative for discharge, double vision and pain.  Cardiovascular: Positive for dyspnea on exertion. Negative for chest pain, claudication, leg swelling,  near-syncope, orthopnea, palpitations, paroxysmal nocturnal dyspnea and syncope.  Respiratory: Negative for hemoptysis and shortness of breath.   Musculoskeletal: Negative for muscle cramps and myalgias.  Gastrointestinal: Negative for abdominal pain, constipation, diarrhea, hematemesis, hematochezia, melena, nausea and vomiting.  Neurological: Negative for dizziness and light-headedness.   PHYSICAL EXAM: Vitals with BMI 09/12/2019 08/24/2019 08/24/2019  Height _0  - -  Weight 220 lbs 14 oz - -  BMI 98.33 - -  Systolic 825 053 976  Diastolic 75 57 66  Pulse 70 - -   CONSTITUTIONAL: Well-developed and well-nourished. No acute distress.  SKIN: Skin is warm and dry. No rash noted. No cyanosis. No pallor. No jaundice HEAD: Normocephalic and atraumatic.  EYES: No scleral icterus MOUTH/THROAT: Moist oral membranes.  NECK: No JVD present. No thyromegaly noted. No carotid bruits  LYMPHATIC: No visible cervical adenopathy.  CHEST Normal respiratory effort. No intercostal retractions  LUNGS: Clear to auscultation bilaterally.  No stridor. No wheezes. No rales.  CARDIOVASCULAR: Regular rate and rhythm, positive B3-A1, soft systolic ejection murmur, no gallops or rubs. ABDOMINAL: Soft, nontender, nondistended, positive bowel sounds, no apparent ascites.  EXTREMITIES: No peripheral edema, warm to touch, 2+ dorsalis pedis and posterior tibial pulses bilaterally. HEMATOLOGIC: No significant bruising NEUROLOGIC: Oriented to person, place, and time. Nonfocal. Normal muscle tone.  PSYCHIATRIC: Normal mood and affect. Normal behavior. Cooperative  CARDIAC DATABASE: EKG: 06/30/2019: Normal sinus rhythm with ventricular rate of 73 bpm, poor R wave progression, nonspecific T wave abnormality, without evidence of underlying injury pattern.  No prior EKGs available for comparison.  Echocardiogram: 07/06/2019: LVEF 55-60%, moderate left ventricular hypertrophy, indeterminate diastolic filling pattern, mild to  moderately dilated left atrium, mild MAC, mild MR, mild TR.  Stress Testing:  Carlton Adam (Walking with mod Bruce)Tetrofosmin Stress Test  08/09/2019: Mild degree Moderate extent perfusion defect consistent with mild (reversible) ischemia located in the mid to distal lateral wall (Left Circumflex Artery region) of left ventricle. Overall LV systolic function is normal without regional wall motion abnormalities. Stress LV EF: 75%. Low risk.  Coronary CTA: 08/24/2019: Coronary Arteries:  Normal coronary origin.  Right dominance. Left main: The left main is a short, large caliber vessel with a normal take off from the left coronary cusp that trifurcates to form a, ramus, and LCx.  There is no plaque or stenosis. LAD: The proximal LAD contains minimal non-calcified plaque (<25%). The mid LAD contains minimal non-calcified plaque (<25%). The distal LAD is patent. The LAD gives off 1 patent diagonal branch. Ramus intermedius: Small, patent vessel. LCx: The LCX is non-dominant. The proximal LCX contains minimal calcified plaque (<25%). The LCX gives off 2 patent obtuse marginal branches. RCA: dominant with normal take off from the right coronary cusp. The proximal RCA contains minimal calcified plaque (<25%). The mid and distal segments are patent without plaque or stenosis. The RCA terminates as a PDA and right posterolateral branch without evidence of plaque or stenosis. IMPRESSION: 1. Coronary calcium score of 248. This was 87th percentile for age  and sex matched controls. 2. Normal coronary origin with right dominance. 3. Minimal, non-obstructive CAD (<25%). 4. Small PFO. 5. Moderate mitral annular calcification. RECOMMENDATIONS: Minimal non-obstructive CAD (0-24%). Consider preventive therapy and risk factor modification.  Heart Catheterization: None  LABORATORY DATA: External Labs: Collected:  Creatinine 0.77 mg/dL. eGFR: Greater than 60 mL/min per 1.73 m Lipid profile: Total  cholesterol 208, triglycerides 102, HDL 59, LDL 129 TSH: 1.23   FINAL MEDICATION LIST END OF ENCOUNTER: Meds ordered this encounter  Medications  . amLODipine (NORVASC) 10 MG tablet    Sig: Take 1 tablet (10 mg total) by mouth daily.    Dispense:  180 tablet    Refill:  3  . metoprolol succinate (TOPROL-XL) 50 MG 24 hr tablet    Sig: Take 1 tablet (50 mg total) by mouth daily. Take with or immediately following a meal. Hold if systolic blood pressure (top blood pressure number) less than 100 mmHg or heart rate less than 60 bpm (pulse).    Dispense:  90 tablet    Refill:  0  . atorvastatin (LIPITOR) 20 MG tablet    Sig: Take 1 tablet (20 mg total) by mouth at bedtime.    Dispense:  90 tablet    Refill:  0    Medications Discontinued During This Encounter  Medication Reason  . busPIRone (BUSPAR) 15 MG tablet Completed Course  . metoprolol tartrate (LOPRESSOR) 25 MG tablet Completed Course  . amLODipine (NORVASC) 5 MG tablet Dose change     Current Outpatient Medications:  .  aspirin 81 MG EC tablet, Take 81 mg by mouth daily. Swallow whole., Disp: , Rfl:  .  citalopram (CELEXA) 40 MG tablet, Take 40 mg by mouth daily., Disp: , Rfl:  .  cyclobenzaprine (FLEXERIL) 5 MG tablet, Take 5 mg by mouth 2 (two) times daily., Disp: , Rfl:  .  docusate sodium (DOK) 100 MG capsule, Take 100 mg by mouth as needed., Disp: , Rfl:  .  hydrochlorothiazide (HYDRODIURIL) 25 MG tablet, Take 1 tablet (25 mg total) by mouth in the morning., Disp: 90 tablet, Rfl: 1 .  lisinopril (ZESTRIL) 40 MG tablet, Take 1 tablet (40 mg total) by mouth daily., Disp: 90 tablet, Rfl: 1 .  meloxicam (MOBIC) 15 MG tablet, Take 15 mg by mouth daily., Disp: , Rfl:  .  nitroGLYCERIN (NITROSTAT) 0.4 MG SL tablet, Place 1 tablet (0.4 mg total) under the tongue every 5 (five) minutes as needed for chest pain., Disp: 90 tablet, Rfl: 3 .  traZODone (DESYREL) 100 MG tablet, , Disp: , Rfl:  .  amLODipine (NORVASC) 10 MG tablet, Take  1 tablet (10 mg total) by mouth daily., Disp: 180 tablet, Rfl: 3 .  atorvastatin (LIPITOR) 20 MG tablet, Take 1 tablet (20 mg total) by mouth at bedtime., Disp: 90 tablet, Rfl: 0 .  metoprolol succinate (TOPROL-XL) 50 MG 24 hr tablet, Take 1 tablet (50 mg total) by mouth daily. Take with or immediately following a meal. Hold if systolic blood pressure (top blood pressure number) less than 100 mmHg or heart rate less than 60 bpm (pulse)., Disp: 90 tablet, Rfl: 0  IMPRESSION:    ICD-10-CM   1. Dyspnea on exertion  R06.00 metoprolol succinate (TOPROL-XL) 50 MG 24 hr tablet  2. Benign hypertension  I10 amLODipine (NORVASC) 10 MG tablet    metoprolol succinate (TOPROL-XL) 50 MG 24 hr tablet  3. Coronary artery calcification seen on computed tomography  I25.10   4. Nonobstructive atherosclerosis of  coronary artery  I25.10 atorvastatin (LIPITOR) 20 MG tablet    Lipid Panel With LDL/HDL Ratio  5. Abnormal pharmacologic myocardial perfusion study  R94.30   6. Class 2 obesity due to excess calories without serious comorbidity with body mass index (BMI) of 35.0 to 35.9 in adult  E66.09    Z68.35   7. Encounter to discuss test results  Z71.2      RECOMMENDATIONS: Kimberly Lamb is a 69 y.o. female whose past medical history and cardiac risk factors include: Coronary artery calcification, nonobstructive CAD, benign essential hypertension, obesity, due to excess calories, postmenopausal female, advanced age.  Coronary artery calcification and nonobstructive coronary artery disease:  Given the abnormal nuclear stress test and effort related dyspnea patient underwent coronary CTA.  Results were reviewed with the patient in great detail and noted above for further reference.  Continue aspirin 81 mg p.o. daily.  Initiate statin therapy, with toward 20 mg p.o. nightly.  We will repeat fasting lipid profile in 6 weeks.  Initiate Toprol-XL 50 mg p.o. every morning.  Increase amlodipine to 10 mg p.o. daily   Educated on importance of risk factor modifications in regards to her modifiable cardiovascular risk factors.  Abnormal nuclear stress test: Patient had undergone a cardiac CTA results were reviewed and noted above for further reference.  Dyspnea on exertion: Chronic and stable.  See above.    Benign essential hypertension: . Medication reconciled.  . Increase amlodipine to 10 mg p.o. daily . Initiate Toprol-XL 50 mg p.o. every morning. . Continue hydrochlorothiazide and lisinopril. . Patient is asked to call the office if her systolic blood pressures are consistently greater than 140 mmHg. . Low salt diet recommended. A diet that is rich in fruits, vegetables, legumes, and low-fat dairy products and low in snacks, sweets, and meats (such as the Dietary Approaches to Stop Hypertension [DASH] diet).   Mixed hyperlipidemia:  Given evidence of coronary artery calcification and nonobstructive coronary disease will initiate p.o. nightly.  Repeat lipid profile in 6 weeks.  Obesity, due to excess calories Body mass index is 35.65 kg/m.  I reviewed with the patient the importance of diet, regular physical activity/exercise, weight loss.    Patient is educated on increasing physical activity gradually as tolerated.  With the goal of moderate intensity exercise for 30 minutes a day 5 days a week.  Orders Placed This Encounter  Procedures  . Lipid Panel With LDL/HDL Ratio   --Continue cardiac medications as reconciled in final medication list. --Return in about 7 weeks (around 10/31/2019) for re-evaluation of symptoms., Lipid follow-up.. Or sooner if needed. --Continue follow-up with your primary care physician regarding the management of your other chronic comorbid conditions.  Patient's questions and concerns were addressed to her satisfaction. She voices understanding of the instructions provided during this encounter.   This note was created using a voice recognition software as a  result there may be grammatical errors inadvertently enclosed that do not reflect the nature of this encounter. Every attempt is made to correct such errors.  Rex Kras, DO, [facility]. [place] Office: [phone]

## 2019-09-13 ENCOUNTER — Telehealth: Payer: Self-pay

## 2019-09-13 NOTE — Telephone Encounter (Signed)
Dr. Odis Hollingshead would like pt to start taking 81 mg Aspirin. I tried calling twice the phone just hangs up

## 2019-09-15 ENCOUNTER — Other Ambulatory Visit: Payer: Self-pay

## 2019-09-15 DIAGNOSIS — R0609 Other forms of dyspnea: Secondary | ICD-10-CM

## 2019-09-15 DIAGNOSIS — I10 Essential (primary) hypertension: Secondary | ICD-10-CM

## 2019-09-15 MED ORDER — METOPROLOL SUCCINATE ER 50 MG PO TB24: 50.0000 mg | ORAL_TABLET | Freq: Every day | ORAL | 2 refills | Status: DC

## 2019-10-27 LAB — LIPID PANEL WITH LDL/HDL RATIO
Cholesterol, Total: 126 mg/dL (ref 100–199)
HDL: 50 mg/dL (ref >39)
LDL Chol Calc (NIH): 63 mg/dL (ref 0–99)
LDL/HDL Ratio: 1.3 ratio (ref 0.0–3.2)
Triglycerides: 60 mg/dL (ref 0–149)
VLDL Cholesterol Cal: 13 mg/dL (ref 5–40)

## 2019-10-31 ENCOUNTER — Other Ambulatory Visit: Payer: Self-pay

## 2019-10-31 ENCOUNTER — Ambulatory Visit: Payer: Medicare Other | Admitting: Cardiology

## 2019-10-31 ENCOUNTER — Encounter: Payer: Self-pay | Admitting: Cardiology

## 2019-10-31 VITALS — BP 137/63 | HR 65 | Resp 16 | Ht 66.0 in | Wt 223.0 lb

## 2019-10-31 DIAGNOSIS — Z712 Person consulting for explanation of examination or test findings: Secondary | ICD-10-CM

## 2019-10-31 DIAGNOSIS — E782 Mixed hyperlipidemia: Secondary | ICD-10-CM

## 2019-10-31 DIAGNOSIS — Z6836 Body mass index (BMI) 36.0-36.9, adult: Secondary | ICD-10-CM

## 2019-10-31 DIAGNOSIS — R0609 Other forms of dyspnea: Secondary | ICD-10-CM

## 2019-10-31 DIAGNOSIS — I10 Essential (primary) hypertension: Secondary | ICD-10-CM

## 2019-10-31 DIAGNOSIS — I2510 Atherosclerotic heart disease of native coronary artery without angina pectoris: Secondary | ICD-10-CM

## 2019-10-31 NOTE — Progress Notes (Signed)
Kimberly Lamb Date of Birth: [birth year] MRN: [id number] Primary Care Provider:[place], Council Mechanic, FNP Primary Cardiologist: Rex Kras, DO (established care 06/30/2019)  Date: 10/31/19 Last Office Visit: 09/12/2019  Chief Complaint  Patient presents with  . Shortness of Breath    Re-evaluation of symptoms  . Hyperlipidemia  . Follow-up    7 week   HPI  Kimberly Lamb is a 69 y.o. female who presents to the office with a chief complaint of " follow up for dyspnea on exertion and hyperlipidemia." Patient's past medical history and cardiac risk factors include: Coronary artery calcification, nonobstructive CAD, benign essential hypertension, obesity, due to excess calories, postmenopausal female, advanced age.  Patient was initially seen in consultation back in March 2021 at the request of her primary care provider for possible evaluation of a cardiac murmur.    During initial office visit patient states that she was having effort related dyspnea that was more noticeable with her friends while they went out on walks.  It was initially thought that her effort related dyspnea was most likely secondary to uncontrolled hypertension.  However, during ischemic evaluation patient was noted to have an abnormal nuclear stress test.  Nuclear stress test suggested reversible ischemia in the LCx distribution and underwent coronary CTA and was found to have nonobstructive coronary disease as mentioned below.   Since last office visit her blood pressures have improved after the medication changes.  Her systolic blood pressures range between 196-222 mmHg and diastolic blood pressures are usually in the 70 mmHg range.  Patient states that she continues to have a degree of shortness of breath with effort related activities which has not completely resolved.    Patient started the statin medications at the last office visit and had repeat blood work done to reevaluate cholesterol levels.  Lipid profile was reviewed  with her in great detail at today's visit.  ALLERGIES: No Known Allergies  MEDICATION LIST PRIOR TO VISIT: Current Outpatient Medications on File Prior to Visit  Medication Sig Dispense Refill  . amLODipine (NORVASC) 10 MG tablet Take 1 tablet (10 mg total) by mouth daily. 180 tablet 3  . aspirin 81 MG EC tablet Take 81 mg by mouth daily. Swallow whole.    Marland Kitchen atorvastatin (LIPITOR) 20 MG tablet Take 1 tablet (20 mg total) by mouth at bedtime. 90 tablet 0  . citalopram (CELEXA) 40 MG tablet Take 40 mg by mouth daily.    . cyclobenzaprine (FLEXERIL) 5 MG tablet Take 5 mg by mouth 2 (two) times daily.    Marland Kitchen docusate sodium (DOK) 100 MG capsule Take 100 mg by mouth as needed.    . hydrochlorothiazide (HYDRODIURIL) 25 MG tablet Take 1 tablet (25 mg total) by mouth in the morning. 90 tablet 1  . lisinopril (ZESTRIL) 40 MG tablet Take 1 tablet (40 mg total) by mouth daily. 90 tablet 1  . meloxicam (MOBIC) 15 MG tablet Take 15 mg by mouth daily.    . metoprolol succinate (TOPROL-XL) 50 MG 24 hr tablet Take 1 tablet (50 mg total) by mouth daily. Take with or immediately following a meal. Hold if systolic blood pressure (top blood pressure number) less than 100 mmHg or heart rate less than 60 bpm (pulse). 90 tablet 2  . nitroGLYCERIN (NITROSTAT) 0.4 MG SL tablet Place 1 tablet (0.4 mg total) under the tongue every 5 (five) minutes as needed for chest pain. 90 tablet 3  . traZODone (DESYREL) 100 MG tablet      No current  facility-administered medications on file prior to visit.    PAST MEDICAL HISTORY: Past Medical History:  Diagnosis Date  . Anxiety   . Coronary artery calcification   . Heart murmur   . Hyperlipidemia   . Hypertension   . Nonobstructive atherosclerosis of coronary artery     PAST SURGICAL HISTORY: Past Surgical History:  Procedure Laterality Date  . ACHILLES TENDON SURGERY  2010  . knee replacement Right 2009   x3 2009, 2012, 2014  . ROTATOR CUFF REPAIR  2011   Right   . SPINAL FUSION     l3  . VAGINAL HYSTERECTOMY  1986    FAMILY HISTORY: The patient family history includes Heart attack in her father; Heart failure in her father.   SOCIAL HISTORY:  The patient  reports that she has never smoked. She has never used smokeless tobacco. She reports that she does not drink alcohol and does not use drugs.  Review of Systems  Constitutional: Negative for chills and fever.  HENT: Negative for hoarse voice and nosebleeds.   Eyes: Negative for discharge, double vision and pain.  Cardiovascular: Positive for dyspnea on exertion. Negative for chest pain, claudication, leg swelling, near-syncope, orthopnea, palpitations, paroxysmal nocturnal dyspnea and syncope.  Respiratory: Negative for hemoptysis and shortness of breath.   Musculoskeletal: Negative for muscle cramps and myalgias.  Gastrointestinal: Negative for abdominal pain, constipation, diarrhea, hematemesis, hematochezia, melena, nausea and vomiting.  Neurological: Negative for dizziness and light-headedness.   PHYSICAL EXAM: Vitals with BMI 10/31/2019 09/12/2019 08/24/2019  Height _0  _1  -  Weight 223 lbs 220 lbs 14 oz -  BMI 91.47 82.95 -  Systolic 621 308 657  Diastolic 63 75 57  Pulse 65 70 -   CONSTITUTIONAL: Well-developed and well-nourished. No acute distress.  SKIN: Skin is warm and dry. No rash noted. No cyanosis. No pallor. No jaundice HEAD: Normocephalic and atraumatic.  EYES: No scleral icterus MOUTH/THROAT: Moist oral membranes.  NECK: No JVD present. No thyromegaly noted. No carotid bruits  LYMPHATIC: No visible cervical adenopathy.  CHEST Normal respiratory effort. No intercostal retractions  LUNGS: Clear to auscultation bilaterally.  No stridor. No wheezes. No rales.  CARDIOVASCULAR: Regular rate and rhythm, positive Q4-O9, soft systolic ejection murmur, no gallops or rubs. ABDOMINAL: Soft, nontender, nondistended, positive bowel sounds, no apparent ascites.  EXTREMITIES: No  peripheral edema, warm to touch, 2+ dorsalis pedis and posterior tibial pulses bilaterally. HEMATOLOGIC: No significant bruising NEUROLOGIC: Oriented to person, place, and time. Nonfocal. Normal muscle tone.  PSYCHIATRIC: Normal mood and affect. Normal behavior. Cooperative  CARDIAC DATABASE: EKG: 06/30/2019: Normal sinus rhythm with ventricular rate of 73 bpm, poor R wave progression, nonspecific T wave abnormality, without evidence of underlying injury pattern.  No prior EKGs available for comparison.  Echocardiogram: 07/06/2019: LVEF 55-60%, moderate left ventricular hypertrophy, indeterminate diastolic filling pattern, mild to moderately dilated left atrium, mild MAC, mild MR, mild TR.  Stress Testing:  Carlton Adam (Walking with mod Bruce)Tetrofosmin Stress Test  08/09/2019: Mild degree Moderate extent perfusion defect consistent with mild (reversible) ischemia located in the mid to distal lateral wall (Left Circumflex Artery region) of left ventricle. Overall LV systolic function is normal without regional wall motion abnormalities. Stress LV EF: 75%. Low risk.  Coronary CTA: 08/24/2019: Coronary Arteries:  Normal coronary origin.  Right dominance. Left main: The left main is a short, large caliber vessel with a normal take off from the left coronary cusp that trifurcates to form a, ramus, and LCx.  There  is no plaque or stenosis. LAD: The proximal LAD contains minimal non-calcified plaque (<25%). The mid LAD contains minimal non-calcified plaque (<25%). The distal LAD is patent. The LAD gives off 1 patent diagonal branch. Ramus intermedius: Small, patent vessel. LCx: The LCX is non-dominant. The proximal LCX contains minimal calcified plaque (<25%). The LCX gives off 2 patent obtuse marginal branches. RCA: dominant with normal take off from the right coronary cusp. The proximal RCA contains minimal calcified plaque (<25%). The mid and distal segments are patent without plaque or stenosis.  The RCA terminates as a PDA and right posterolateral branch without evidence of plaque or stenosis. IMPRESSION: 1. Coronary calcium score of 248. This was 87th percentile for age and sex matched controls. 2. Normal coronary origin with right dominance. 3. Minimal, non-obstructive CAD (<25%). 4. Small PFO. 5. Moderate mitral annular calcification. RECOMMENDATIONS: Minimal non-obstructive CAD (0-24%). Consider preventive therapy and risk factor modification.  Heart Catheterization: None  LABORATORY DATA: External Labs: Collected:  Creatinine 0.77 mg/dL. eGFR: Greater than 60 mL/min per 1.73 m Lipid profile: Total cholesterol 208, triglycerides 102, HDL 59, LDL 129 TSH: 1.23   Lipid Panel     Component Value Date/Time   CHOL 126 10/26/2019 0820   TRIG 60 10/26/2019 0820   HDL 50 10/26/2019 0820   LDLCALC 63 10/26/2019 0820   LABVLDL 13 10/26/2019 0820   FINAL MEDICATION LIST END OF ENCOUNTER: No orders of the defined types were placed in this encounter.   There are no discontinued medications.   Current Outpatient Medications:  .  amLODipine (NORVASC) 10 MG tablet, Take 1 tablet (10 mg total) by mouth daily., Disp: 180 tablet, Rfl: 3 .  aspirin 81 MG EC tablet, Take 81 mg by mouth daily. Swallow whole., Disp: , Rfl:  .  atorvastatin (LIPITOR) 20 MG tablet, Take 1 tablet (20 mg total) by mouth at bedtime., Disp: 90 tablet, Rfl: 0 .  citalopram (CELEXA) 40 MG tablet, Take 40 mg by mouth daily., Disp: , Rfl:  .  cyclobenzaprine (FLEXERIL) 5 MG tablet, Take 5 mg by mouth 2 (two) times daily., Disp: , Rfl:  .  docusate sodium (DOK) 100 MG capsule, Take 100 mg by mouth as needed., Disp: , Rfl:  .  hydrochlorothiazide (HYDRODIURIL) 25 MG tablet, Take 1 tablet (25 mg total) by mouth in the morning., Disp: 90 tablet, Rfl: 1 .  lisinopril (ZESTRIL) 40 MG tablet, Take 1 tablet (40 mg total) by mouth daily., Disp: 90 tablet, Rfl: 1 .  meloxicam (MOBIC) 15 MG tablet, Take 15 mg by  mouth daily., Disp: , Rfl:  .  metoprolol succinate (TOPROL-XL) 50 MG 24 hr tablet, Take 1 tablet (50 mg total) by mouth daily. Take with or immediately following a meal. Hold if systolic blood pressure (top blood pressure number) less than 100 mmHg or heart rate less than 60 bpm (pulse)., Disp: 90 tablet, Rfl: 2 .  nitroGLYCERIN (NITROSTAT) 0.4 MG SL tablet, Place 1 tablet (0.4 mg total) under the tongue every 5 (five) minutes as needed for chest pain., Disp: 90 tablet, Rfl: 3 .  traZODone (DESYREL) 100 MG tablet, , Disp: , Rfl:   IMPRESSION:    ICD-10-CM   1. Nonobstructive atherosclerosis of coronary artery  I25.10   2. Dyspnea on exertion  R06.00   3. Benign hypertension  I10   4. Coronary artery calcification seen on computed tomography  I25.10   5. Mixed hyperlipidemia  E78.2   6. Class 2 obesity due to excess  calories without serious comorbidity with body mass index (BMI) of 36.0 to 36.9 in adult  E66.09    Z68.36   7. Encounter to discuss test results  Z71.2      RECOMMENDATIONS: Kimberly Lamb is a 69 y.o. female whose past medical history and cardiac risk factors include: Coronary artery calcification, nonobstructive CAD, benign essential hypertension, obesity, due to excess calories, postmenopausal female, advanced age.  Coronary artery calcification and nonobstructive coronary artery disease:  Continue aspirin 81 mg p.o. daily.  Continue Lipitor 20 mg p.o. nightly.  Continue Toprol-XL 50 mg p.o. every morning.  Continue amlodipine 10 mg p.o. daily.  Educated on importance of risk factor modifications in regards to her modifiable cardiovascular risk factors.  Dyspnea on exertion: Chronic and stable.  See above.  Patient is asked to discuss it further with her primary care provider in regards to noncardiac causes.  Benign essential hypertension: Improving. . Medication reconciled.  . Home blood pressure log reviewed and significantly improved compared to the last  visit. . Continue amlodipine to 10 mg p.o. daily . Continue Toprol-XL 50 mg p.o. every morning. . Continue hydrochlorothiazide and lisinopril. . Patient is asked to call the office if her systolic blood pressures are consistently greater than 140 mmHg. . Low salt diet recommended. A diet that is rich in fruits, vegetables, legumes, and low-fat dairy products and low in snacks, sweets, and meats (such as the Dietary Approaches to Stop Hypertension [DASH] diet).   Mixed hyperlipidemia:  Patient was started on statin therapy given the coronary artery calcification.  She was started on Lipitor 20 mg p.o. nightly and repeat lipid profile was reviewed with her at today's office visit.  Her LDL has improved significantly after pharmacological therapy.  Obesity, due to excess calories Body mass index is 35.99 kg/m.  I reviewed with the patient the importance of diet, regular physical activity/exercise, weight loss.    Patient is educated on increasing physical activity gradually as tolerated.  With the goal of moderate intensity exercise for 30 minutes a day 5 days a week.  No orders of the defined types were placed in this encounter.  --Continue cardiac medications as reconciled in final medication list. --Return in about 6 months (around 05/02/2020) for re-evaluation of symptoms dyspnea. . Or sooner if needed. --Continue follow-up with your primary care physician regarding the management of your other chronic comorbid conditions.  Patient's questions and concerns were addressed to her satisfaction. She voices understanding of the instructions provided during this encounter.   This note was created using a voice recognition software as a result there may be grammatical errors inadvertently enclosed that do not reflect the nature of this encounter. Every attempt is made to correct such errors.  Rex Kras, [place], [facility]  Pager: [phone] Office: [phone]

## 2019-12-11 ENCOUNTER — Other Ambulatory Visit: Payer: Self-pay | Admitting: Cardiology

## 2019-12-11 DIAGNOSIS — I2510 Atherosclerotic heart disease of native coronary artery without angina pectoris: Secondary | ICD-10-CM

## 2020-01-17 ENCOUNTER — Other Ambulatory Visit: Payer: Self-pay | Admitting: Cardiology

## 2020-01-17 DIAGNOSIS — I10 Essential (primary) hypertension: Secondary | ICD-10-CM

## 2020-01-17 DIAGNOSIS — R0609 Other forms of dyspnea: Secondary | ICD-10-CM

## 2020-01-20 ENCOUNTER — Other Ambulatory Visit: Payer: Self-pay | Admitting: Cardiology

## 2020-01-20 DIAGNOSIS — I10 Essential (primary) hypertension: Secondary | ICD-10-CM

## 2020-01-20 DIAGNOSIS — R0609 Other forms of dyspnea: Secondary | ICD-10-CM

## 2020-05-02 ENCOUNTER — Other Ambulatory Visit: Payer: Self-pay

## 2020-05-02 ENCOUNTER — Encounter: Payer: Self-pay | Admitting: Cardiology

## 2020-05-02 ENCOUNTER — Ambulatory Visit: Payer: Medicare Other | Admitting: Cardiology

## 2020-05-02 VITALS — BP 118/70 | HR 69 | Temp 97.6°F | Ht 66.0 in | Wt 217.0 lb

## 2020-05-02 DIAGNOSIS — E6609 Other obesity due to excess calories: Secondary | ICD-10-CM

## 2020-05-02 DIAGNOSIS — E782 Mixed hyperlipidemia: Secondary | ICD-10-CM

## 2020-05-02 DIAGNOSIS — R0609 Other forms of dyspnea: Secondary | ICD-10-CM

## 2020-05-02 DIAGNOSIS — I2510 Atherosclerotic heart disease of native coronary artery without angina pectoris: Secondary | ICD-10-CM

## 2020-05-02 DIAGNOSIS — R0600 Dyspnea, unspecified: Secondary | ICD-10-CM

## 2020-05-02 DIAGNOSIS — I10 Essential (primary) hypertension: Secondary | ICD-10-CM

## 2020-05-02 DIAGNOSIS — M7989 Other specified soft tissue disorders: Secondary | ICD-10-CM

## 2020-05-02 MED ORDER — AMLODIPINE BESYLATE 10 MG PO TABS: 10.0000 mg | ORAL_TABLET | Freq: Every day | ORAL | 1 refills | Status: DC

## 2020-05-02 MED ORDER — METOPROLOL SUCCINATE ER 50 MG PO TB24: 50.0000 mg | ORAL_TABLET | Freq: Every morning | ORAL | 1 refills | Status: DC

## 2020-05-02 MED ORDER — ATORVASTATIN CALCIUM 20 MG PO TABS: 20.0000 mg | ORAL_TABLET | Freq: Every day | ORAL | 1 refills | Status: DC

## 2020-05-02 MED ORDER — LISINOPRIL 40 MG PO TABS: 40.0000 mg | ORAL_TABLET | Freq: Every day | ORAL | 1 refills | Status: AC

## 2020-05-02 MED ORDER — HYDROCHLOROTHIAZIDE 25 MG PO TABS: 25.0000 mg | ORAL_TABLET | Freq: Every morning | ORAL | 1 refills | Status: AC

## 2020-05-02 MED ORDER — FUROSEMIDE 20 MG PO TABS: 10.0000 mg | ORAL_TABLET | Freq: Every morning | ORAL | 0 refills | Status: DC

## 2020-05-02 NOTE — Progress Notes (Signed)
[name] Date of Birth: January 31, 1951 MRN: [id number] Primary Care Provider:[place], Council Mechanic, FNP Primary Cardiologist: Rex Kras, DO (established care 06/30/2019)  Date: 05/02/20 Last Office Visit: 10/31/2019  Chief Complaint  Patient presents with  . Coronary Artery Disease  . Edema   HPI  Kimberly Lamb is a 70 y.o. female who presents to the office with a chief complaint of " 66-monthfollow-up for CAD reevaluation and lower extremity swelling." Patient's past medical history and cardiac risk factors include: Coronary artery calcification, benign essential hypertension, obesity, due to excess calories, postmenopausal female, advanced age.  Patient was initially seen in consultation back in March 2021 at the request of her primary care provider for possible evaluation of a cardiac murmur.    During subsequent visit she was complaining of effort related dyspnea and underwent an ischemic evaluation.  She had an abnormal nuclear stress test suggestive of reversible ischemia.  Thereafter she underwent coronary CTA and was found to have calcified atherosclerosis without obstructive CAD.  Since then her risk factors have been addressed with increasing antihypertensive medications, initiation of aspirin and statin therapy.  She comes today for 628-monthollow-up.  She denies any chest pain or shortness of breath at rest or with effort related activities.  No hospitalizations or urgent care visits for cardiovascular symptoms.  Review of systems are positive for lower extremity swelling.  Patient states that by the end of the day she has bilateral lower extremity swelling starting from mid shin to the distal phalanges.  The swelling usually improves with elevation.  Patient is still concerned and would like prescription for Lasix.  In addition, she has been evaluated for sleep apnea and test tested positive.  She is in the process of getting a CPAP machine. She also has a PCP appt coming up on  01/028/2022.  ALLERGIES: No Known Allergies  MEDICATION LIST PRIOR TO VISIT: Current Outpatient Medications on File Prior to Visit  Medication Sig Dispense Refill  . aspirin 81 MG EC tablet Take 81 mg by mouth daily. Swallow whole.    . citalopram (CELEXA) 40 MG tablet Take 40 mg by mouth daily.    . Marland Kitchenocusate sodium (COLACE) 100 MG capsule Take 100 mg by mouth as needed.    . meloxicam (MOBIC) 15 MG tablet Take 15 mg by mouth daily.    . nitroGLYCERIN (NITROSTAT) 0.4 MG SL tablet Place 1 tablet (0.4 mg total) under the tongue every 5 (five) minutes as needed for chest pain. 90 tablet 3  . traZODone (DESYREL) 100 MG tablet      No current facility-administered medications on file prior to visit.    PAST MEDICAL HISTORY: Past Medical History:  Diagnosis Date  . Anxiety   . Coronary artery calcification   . Heart murmur   . Hyperlipidemia   . Hypertension   . Nonobstructive atherosclerosis of coronary artery     PAST SURGICAL HISTORY: Past Surgical History:  Procedure Laterality Date  . ACHILLES TENDON SURGERY  2010  . knee replacement Right 2009   x3 2009, 2012, 2014  . ROTATOR CUFF REPAIR  2011   Right  . SPINAL FUSION     l3  . VAGINAL HYSTERECTOMY  1986    FAMILY HISTORY: The patient family history includes Heart attack in her father; Heart failure in her father.   SOCIAL HISTORY:  The patient  reports that she has never smoked. She has never used smokeless tobacco. She reports that she does not drink alcohol and does  not use drugs.  Review of Systems  Constitutional: Negative for chills and fever.  HENT: Negative for hoarse voice and nosebleeds.   Eyes: Negative for discharge, double vision and pain.  Cardiovascular: Positive for leg swelling. Negative for chest pain, claudication, dyspnea on exertion, near-syncope, orthopnea, palpitations, paroxysmal nocturnal dyspnea and syncope.  Respiratory: Negative for hemoptysis and shortness of breath.    Musculoskeletal: Negative for muscle cramps and myalgias.  Gastrointestinal: Negative for abdominal pain, constipation, diarrhea, hematemesis, hematochezia, melena, nausea and vomiting.  Neurological: Negative for dizziness and light-headedness.   PHYSICAL EXAM: Vitals with BMI 05/02/2020 05/02/2020 10/31/2019  Height - _0  _1   Weight - 217 lbs 223 lbs  BMI - 65.46 50.35  Systolic 465 681 275  Diastolic 70 68 63  Pulse 69 69 65   CONSTITUTIONAL: Well-developed and well-nourished. No acute distress.  SKIN: Skin is warm and dry. No rash noted. No cyanosis. No pallor. No jaundice HEAD: Normocephalic and atraumatic.  EYES: No scleral icterus MOUTH/THROAT: Moist oral membranes.  NECK: No JVD present. No thyromegaly noted. No carotid bruits  LYMPHATIC: No visible cervical adenopathy.  CHEST Normal respiratory effort. No intercostal retractions  LUNGS: Clear to auscultation bilaterally.  No stridor. No wheezes. No rales.  CARDIOVASCULAR: Regular rate and rhythm, positive T7-G0, soft systolic ejection murmur, no gallops or rubs. ABDOMINAL: Soft, nontender, nondistended, positive bowel sounds, no apparent ascites.  EXTREMITIES: No peripheral edema, warm to touch, 2+ dorsalis pedis and posterior tibial pulses bilaterally. HEMATOLOGIC: No significant bruising NEUROLOGIC: Oriented to person, place, and time. Nonfocal. Normal muscle tone.  PSYCHIATRIC: Normal mood and affect. Normal behavior. Cooperative  CARDIAC DATABASE: EKG: 05/02/2020: Normal sinus rhythm, 63 bpm, without underlying ischemia or injury pattern.    Echocardiogram: 07/06/2019: LVEF 55-60%, moderate left ventricular hypertrophy, indeterminate diastolic filling pattern, mild to moderately dilated left atrium, mild MAC, mild MR, mild TR.  Stress Testing:  Carlton Adam (Walking with mod Bruce)Tetrofosmin Stress Test  08/09/2019: Mild degree Moderate extent perfusion defect consistent with mild (reversible) ischemia located in the  mid to distal lateral wall (Left Circumflex Artery region) of left ventricle. Overall LV systolic function is normal without regional wall motion abnormalities. Stress LV EF: 75%. Low risk.  Coronary CTA: 08/24/2019: Coronary Arteries:  Normal coronary origin.  Right dominance. Left main: The left main is a short, large caliber vessel with a normal take off from the left coronary cusp that trifurcates to form a, ramus, and LCx.  There is no plaque or stenosis. LAD: The proximal LAD contains minimal non-calcified plaque (<25%). The mid LAD contains minimal non-calcified plaque (<25%). The distal LAD is patent. The LAD gives off 1 patent diagonal branch. Ramus intermedius: Small, patent vessel. LCx: The LCX is non-dominant. The proximal LCX contains minimal calcified plaque (<25%). The LCX gives off 2 patent obtuse marginal branches. RCA: dominant with normal take off from the right coronary cusp. The proximal RCA contains minimal calcified plaque (<25%). The mid and distal segments are patent without plaque or stenosis. The RCA terminates as a PDA and right posterolateral branch without evidence of plaque or stenosis. IMPRESSION: 1. Coronary calcium score of 248. This was 87th percentile for age and sex matched controls. 2. Normal coronary origin with right dominance. 3. Minimal, non-obstructive CAD (<25%). 4. Small PFO. 5. Moderate mitral annular calcification. RECOMMENDATIONS: Minimal non-obstructive CAD (0-24%). Consider preventive therapy and risk factor modification.  Heart Catheterization: None  LABORATORY DATA: External Labs: Collected:  Creatinine 0.77 mg/dL. eGFR: Greater than 60 mL/min  per 1.73 m Lipid profile: Total cholesterol 208, triglycerides 102, HDL 59, LDL 129 TSH: 1.23   Lipid Panel     Component Value Date/Time   CHOL 126 10/26/2019 0820   TRIG 60 10/26/2019 0820   HDL 50 10/26/2019 0820   LDLCALC 63 10/26/2019 0820   LABVLDL 13 10/26/2019 0820   FINAL  MEDICATION LIST END OF ENCOUNTER: Meds ordered this encounter  Medications  . furosemide (LASIX) 20 MG tablet    Sig: Take 0.5 tablets (10 mg total) by mouth in the morning.    Dispense:  45 tablet    Refill:  0  . metoprolol succinate (TOPROL-XL) 50 MG 24 hr tablet    Sig: Take 1 tablet (50 mg total) by mouth in the morning. Take with or immediately following a meal. Hold if systolic blood pressure (top number) less than 100 mmHg or heart rate less than 60 bpm (pulse).    Dispense:  90 tablet    Refill:  1  . lisinopril (ZESTRIL) 40 MG tablet    Sig: Take 1 tablet (40 mg total) by mouth daily.    Dispense:  90 tablet    Refill:  1  . hydrochlorothiazide (HYDRODIURIL) 25 MG tablet    Sig: Take 1 tablet (25 mg total) by mouth in the morning.    Dispense:  90 tablet    Refill:  1  . atorvastatin (LIPITOR) 20 MG tablet    Sig: Take 1 tablet (20 mg total) by mouth at bedtime.    Dispense:  90 tablet    Refill:  1  . amLODipine (NORVASC) 10 MG tablet    Sig: Take 1 tablet (10 mg total) by mouth daily.    Dispense:  90 tablet    Refill:  1    Medications Discontinued During This Encounter  Medication Reason  . cyclobenzaprine (FLEXERIL) 5 MG tablet Completed Course  . amLODipine (NORVASC) 10 MG tablet Reorder  . metoprolol succinate (TOPROL-XL) 50 MG 24 hr tablet Reorder  . atorvastatin (LIPITOR) 20 MG tablet Reorder  . hydrochlorothiazide (HYDRODIURIL) 25 MG tablet Reorder  . lisinopril (ZESTRIL) 40 MG tablet Reorder     Current Outpatient Medications:  .  aspirin 81 MG EC tablet, Take 81 mg by mouth daily. Swallow whole., Disp: , Rfl:  .  citalopram (CELEXA) 40 MG tablet, Take 40 mg by mouth daily., Disp: , Rfl:  .  docusate sodium (COLACE) 100 MG capsule, Take 100 mg by mouth as needed., Disp: , Rfl:  .  furosemide (LASIX) 20 MG tablet, Take 0.5 tablets (10 mg total) by mouth in the morning., Disp: 45 tablet, Rfl: 0 .  meloxicam (MOBIC) 15 MG tablet, Take 15 mg by mouth  daily., Disp: , Rfl:  .  nitroGLYCERIN (NITROSTAT) 0.4 MG SL tablet, Place 1 tablet (0.4 mg total) under the tongue every 5 (five) minutes as needed for chest pain., Disp: 90 tablet, Rfl: 3 .  traZODone (DESYREL) 100 MG tablet, , Disp: , Rfl:  .  amLODipine (NORVASC) 10 MG tablet, Take 1 tablet (10 mg total) by mouth daily., Disp: 90 tablet, Rfl: 1 .  atorvastatin (LIPITOR) 20 MG tablet, Take 1 tablet (20 mg total) by mouth at bedtime., Disp: 90 tablet, Rfl: 1 .  hydrochlorothiazide (HYDRODIURIL) 25 MG tablet, Take 1 tablet (25 mg total) by mouth in the morning., Disp: 90 tablet, Rfl: 1 .  lisinopril (ZESTRIL) 40 MG tablet, Take 1 tablet (40 mg total) by mouth daily., Disp: 90  tablet, Rfl: 1 .  metoprolol succinate (TOPROL-XL) 50 MG 24 hr tablet, Take 1 tablet (50 mg total) by mouth in the morning. Take with or immediately following a meal. Hold if systolic blood pressure (top number) less than 100 mmHg or heart rate less than 60 bpm (pulse)., Disp: 90 tablet, Rfl: 1  IMPRESSION:    ICD-10-CM   1. Nonobstructive atherosclerosis of coronary artery  I25.10 EKG 12-Lead    atorvastatin (LIPITOR) 20 MG tablet  2. Coronary artery calcification seen on computed tomography  I25.10   3. Benign hypertension  I10 furosemide (LASIX) 20 MG tablet    metoprolol succinate (TOPROL-XL) 50 MG 24 hr tablet    lisinopril (ZESTRIL) 40 MG tablet    hydrochlorothiazide (HYDRODIURIL) 25 MG tablet    amLODipine (NORVASC) 10 MG tablet  4. Leg swelling  M79.89 furosemide (LASIX) 20 MG tablet  5. Class 2 obesity due to excess calories without serious comorbidity with body mass index (BMI) of 35.0 to 35.9 in adult  E66.09    Z68.35   6. Mixed hyperlipidemia  E78.2   7. Dyspnea on exertion  R06.00 metoprolol succinate (TOPROL-XL) 50 MG 24 hr tablet    lisinopril (ZESTRIL) 40 MG tablet    hydrochlorothiazide (HYDRODIURIL) 25 MG tablet     RECOMMENDATIONS: Kimberly Lamb is a 70 y.o. female whose past medical history  and cardiac risk factors include: Coronary artery calcification, nonobstructive CAD, benign essential hypertension, obesity, due to excess calories, postmenopausal female, advanced age.  Nonobstructive atherosclerosis of the coronary arteries due to calcified lesions:  Continue aspirin 81 mg p.o. daily.  Refilled Lipitor, Toprol-XL, amlodipine, lisinopril.    Overall asymptomatic.  No additional cardiovascular testing needed at this time  Educated on importance of risk factor modifications in regards to her modifiable cardiovascular risk factors.  Lower extremity swelling:  On physical examination there is no bilateral lower extremity swelling.  Educated the patient that it may be most likely secondary to chronic venous insufficiency given her age and how she describes her symptoms.  No additional work-up needed at this time unless if her symptoms worsen.  However, patient requests as needed use of Lasix due to lower extremity swelling.  Will prescribe Lasix 10 mg p.o. daily for as needed use of lower extremity swelling.  Patient is encouraged to have 1 banana when she does take Lasix to help replace potassium.  Patient is asked to come back to the office for a sooner appointment if she is requiring daily use of Lasix.  For reevaluation.  She verbalizes understanding.  Dyspnea on exertion: Resolved.   Benign essential hypertension: Stable . Medication reconciled.  . Office blood pressure is well controlled. . Refilled her antihypertensive medications. . She is diagnosed with sleep apnea.  She is encouraged to try the CPAP machine when she receives it in the next month or so. . Patient is asked to call the office if her systolic blood pressures are consistently greater than 140 mmHg. . Low salt diet recommended. A diet that is rich in fruits, vegetables, legumes, and low-fat dairy products and low in snacks, sweets, and meats (such as the Dietary Approaches to Stop Hypertension [DASH]  diet).   Mixed hyperlipidemia:  Patient was started on statin therapy given the coronary artery calcification.  Refilled Lipitor 20 mg p.o. nightly.  Fasting lipid profile will be performed with a yearly physical with her PCP in January 2022.  He is asked to send [place] a copy of the results or  to bring it in at the next visit.  Obesity, due to excess calories Body mass index is 35.02 kg/m.  I reviewed with the patient the importance of diet, regular physical activity/exercise, weight loss.    Patient is educated on increasing physical activity gradually as tolerated.  With the goal of moderate intensity exercise for 30 minutes a day 5 days a week.  Orders Placed This Encounter  Procedures  . EKG 12-Lead   --Continue cardiac medications as reconciled in final medication list. --Return in about 1 year (around 05/02/2021) for Follow up, CAD. Or sooner if needed. --Continue follow-up with your primary care physician regarding the management of your other chronic comorbid conditions.  Patient's questions and concerns were addressed to her satisfaction. She voices understanding of the instructions provided during this encounter.   This note was created using a voice recognition software as a result there may be grammatical errors inadvertently enclosed that do not reflect the nature of this encounter. Every attempt is made to correct such errors.  Rex Kras, [place], [facility]  Pager: [phone] Office: [phone]

## 2020-06-05 ENCOUNTER — Other Ambulatory Visit: Payer: Self-pay | Admitting: Cardiology

## 2020-06-05 DIAGNOSIS — R0609 Other forms of dyspnea: Secondary | ICD-10-CM

## 2020-06-05 DIAGNOSIS — I10 Essential (primary) hypertension: Secondary | ICD-10-CM

## 2020-06-05 DIAGNOSIS — R0600 Dyspnea, unspecified: Secondary | ICD-10-CM

## 2021-02-26 IMAGING — CT CT HEART MORP W/ CTA COR W/ SCORE W/ CA W/CM &/OR W/O CM
1 series · 8 of 10 positions shown, 10 images · non-contrast
Comparison: None.
COMPARISON: None.

Addendum:
EXAM:
OVER-READ INTERPRETATION  CT CHEST

The following report is an over-read performed by radiologist Dr.
Bolanle Mane [[email or web address]] on 08/24/2019. This
over-read does not include interpretation of cardiac or coronary
anatomy or pathology. The coronary calcium score/coronary CTA
interpretation by the cardiologist is attached.
CLINICAL DATA: Abnormal stress test
Cardiac/Coronary CTA
TECHNIQUE: The patient was scanned on a Phillips Force scanner. A 100 kV
prospective scan was triggered in the descending thoracic aorta at
111 HU's. Axial non-contrast 3 mm slices were carried out through
the heart. The data set was analyzed on a dedicated work station and
scored using the Agatson method. Gantry rotation speed was 250 msecs
and collimation was .6 mm. No beta blockade and 0.8 mg of sl NTG was
given. The 3D data set was reconstructed in 5% intervals of the
35-75% of the R-R cycle. Diastolic phases were analyzed on a
dedicated work station using MPR, MIP and VRT modes. The patient
received 80 cc of contrast.

[Series 466: findings · 8 of 10 slices shown, 10 images]
[im 2/10  vessel]
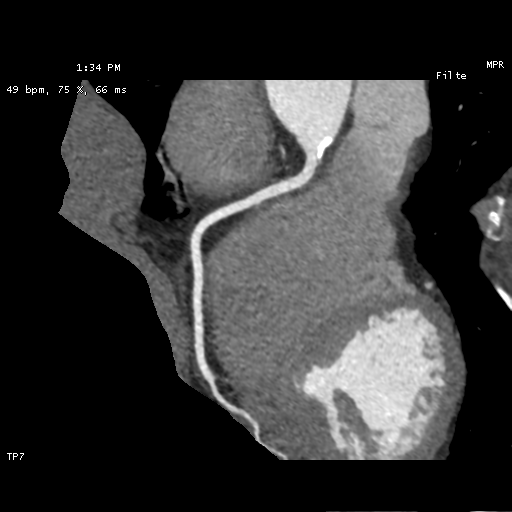
[im 2/10  lung]
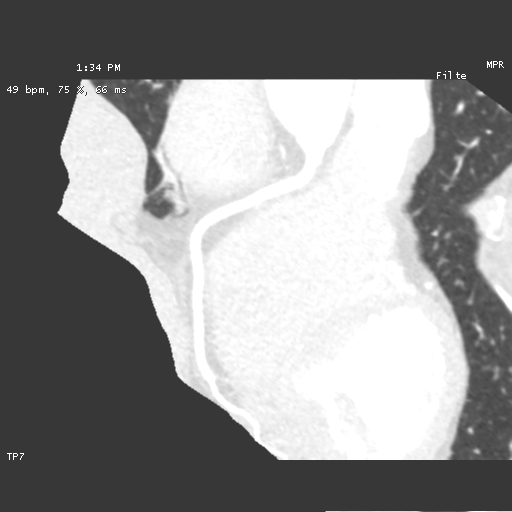
[im 3/10  vessel]
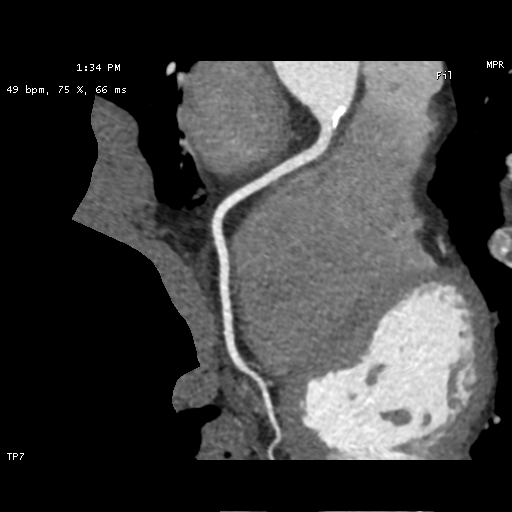
[im 4/10  vessel]
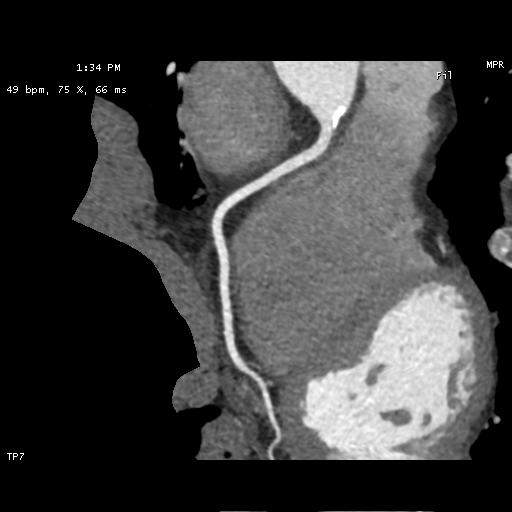
[im 5/10  vessel]
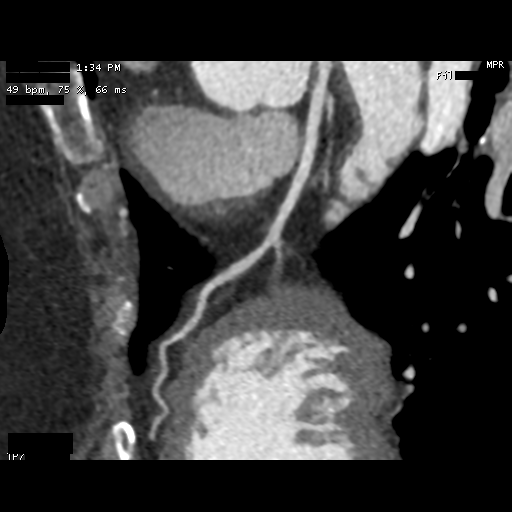
[im 6/10  vessel]
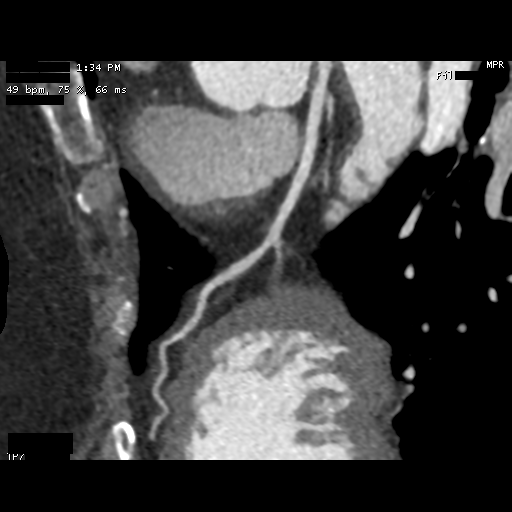
[im 6/10  lung]
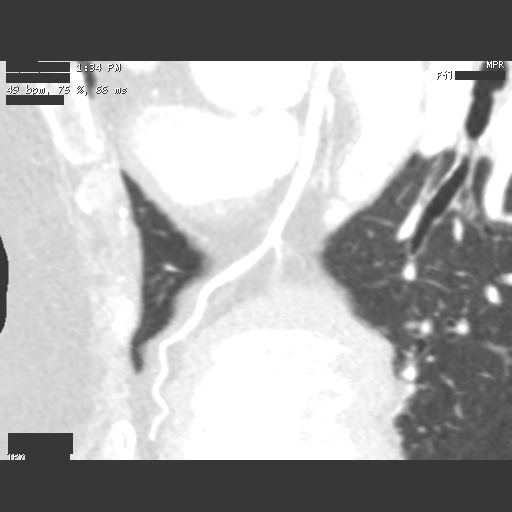
[im 7/10  vessel]
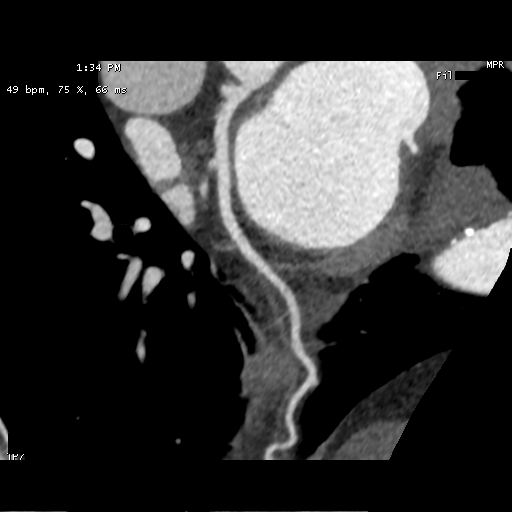
[im 8/10  vessel]
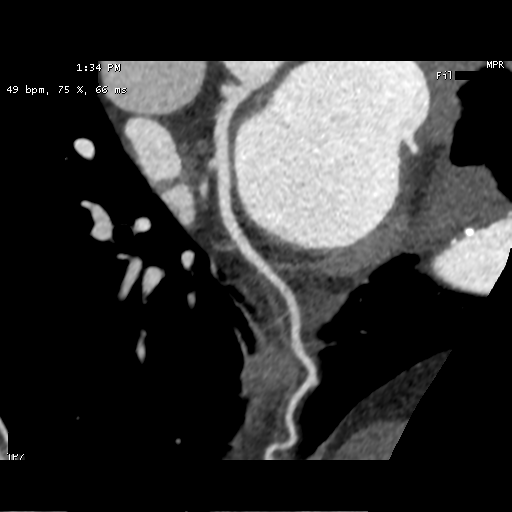
[im 9/10  vessel]
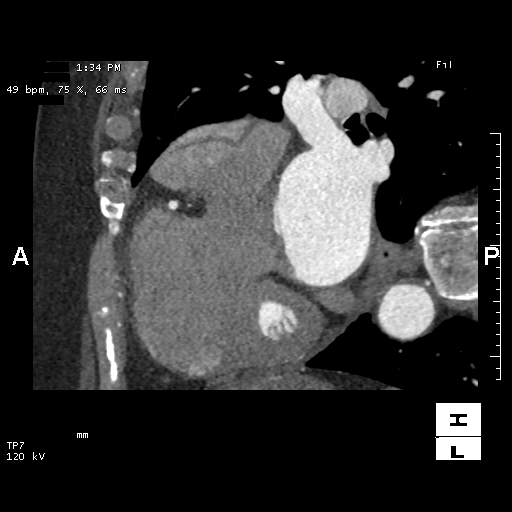

[8 of 10 positions shown; findings below may reference images not displayed]

FINDINGS: Aortic atherosclerosis. Within the visualized portions of the thorax
there are no suspicious appearing pulmonary nodules or masses, there
is no acute consolidative airspace disease, no pleural effusions, no
pneumothorax and no lymphadenopathy. Visualized portions of the
upper abdomen are unremarkable. There are no aggressive appearing
lytic or blastic lesions noted in the visualized portions of the
skeleton.
IMPRESSION: 1.  Aortic Atherosclerosis (N1KGS-YNM.M).
FINDINGS: Image quality: excellent.

Noise artifact is: Limited.

Coronary Arteries:  Normal coronary origin.  Right dominance.

Left main: The left main is a short, large caliber vessel with a
normal take off from the left coronary cusp that trifurcates to form
a left anterior descending artery, ramus intermedius, and a left
circumflex artery. There is no plaque or stenosis.

Left anterior descending artery: The proximal LAD contains minimal
non-calcified plaque (<25%). The mid LAD contains minimal
non-calcified plaque (<25%). The distal LAD is patent. The LAD gives
off 1 patent diagonal branch.

Ramus intermedius: Small, patent vessel.

Left circumflex artery: The LCX is non-dominant. The proximal LCX
contains minimal calcified plaque (<25%). The LCX gives off 2 patent
obtuse marginal branches.

Right coronary artery: The RCA is dominant with normal take off from
the right coronary cusp. The proximal RCA contains minimal calcified
plaque (<25%). The mid and distal segments are patent without plaque
or stenosis. The RCA terminates as a PDA and right posterolateral
branch without evidence of plaque or stenosis.

Right Atrium: Right atrial size is within normal limits.

Right Ventricle: The right ventricular cavity is within normal
limits.

Left Atrium: Left atrial size is normal in size with no left atrial
appendage filling defect. A small PFO is present.

Left Ventricle: The ventricular cavity size is within normal limits.
There are no stigmata of prior infarction. There is no abnormal
filling defect.

Pulmonary arteries: Normal in size without proximal filling defect.

Pulmonary veins: Normal pulmonary venous drainage.

Pericardium: Normal thickness with no significant effusion or
calcium present.

Cardiac valves: The aortic valve is trileaflet without significant
calcification. The mitral valve is normal structure with moderate
mitral annular calcification.

Aorta: Normal caliber with no significant disease.

Extra-cardiac findings: See attached radiology report for
non-cardiac structures.
IMPRESSION: 1. Coronary calcium score of 248. This was 87th percentile for age
and sex matched controls.

2. Normal coronary origin with right dominance.

3. Minimal, non-obstructive CAD (<25%).

4. Small PFO.

5. Moderate mitral annular calcification.

RECOMMENDATIONS:
1. Minimal non-obstructive CAD (0-24%). Consider preventive therapy
and risk factor modification.

*** End of Addendum ***
EXAM:
OVER-READ INTERPRETATION  CT CHEST

The following report is an over-read performed by radiologist Dr.
Bolanle Mane [REDACTED] on 08/24/2019. This
over-read does not include interpretation of cardiac or coronary
anatomy or pathology. The coronary calcium score/coronary CTA
interpretation by the cardiologist is attached.
FINDINGS: Aortic atherosclerosis. Within the visualized portions of the thorax
there are no suspicious appearing pulmonary nodules or masses, there
is no acute consolidative airspace disease, no pleural effusions, no
pneumothorax and no lymphadenopathy. Visualized portions of the
upper abdomen are unremarkable. There are no aggressive appearing
lytic or blastic lesions noted in the visualized portions of the
skeleton.
IMPRESSION: 1.  Aortic Atherosclerosis (N1KGS-YNM.M).

## 2021-05-01 ENCOUNTER — Other Ambulatory Visit: Payer: Self-pay

## 2021-05-01 ENCOUNTER — Ambulatory Visit: Payer: Medicare Other | Admitting: Cardiology

## 2021-05-01 ENCOUNTER — Encounter: Payer: Self-pay | Admitting: Cardiology

## 2021-05-01 VITALS — BP 126/76 | HR 63 | Temp 97.1°F | Resp 16 | Ht 66.0 in | Wt 219.0 lb

## 2021-05-01 DIAGNOSIS — I2510 Atherosclerotic heart disease of native coronary artery without angina pectoris: Secondary | ICD-10-CM

## 2021-05-01 DIAGNOSIS — E6609 Other obesity due to excess calories: Secondary | ICD-10-CM

## 2021-05-01 DIAGNOSIS — Z6835 Body mass index (BMI) 35.0-35.9, adult: Secondary | ICD-10-CM

## 2021-05-01 DIAGNOSIS — E782 Mixed hyperlipidemia: Secondary | ICD-10-CM

## 2021-05-01 DIAGNOSIS — I10 Essential (primary) hypertension: Secondary | ICD-10-CM

## 2021-05-01 NOTE — Progress Notes (Signed)
Kimberly Lamb Date of Birth: May 23, 1950 MRN: [id number] Primary Care Provider:[place], [place] Mechanic, FNP Primary Cardiologist: Rex Kras, DO (established care 06/30/2019)  Date: 05/01/21 Last Office Visit: 05/02/2020  Chief Complaint  Patient presents with   Coronary Artery Disease   Follow-up   HPI  Kimberly Lamb is a 72 y.o. female who presents to the office with a chief complaint of " 1 year follow-up for heart disease management." Patient's past medical history and cardiac risk factors include: Moderate coronary artery calcification, nonobstructive CAD, obstructive sleep apnea on CPAP, benign essential hypertension, obesity, due to excess calories, postmenopausal female, advanced age.  Initially saw her in consultation back in March 2021 for evaluation of a cardiac murmur.  However during subsequent visit she was experiencing shortness of breath with effort related activity and given her age and risk factors she underwent an ischemic evaluation.  Her nuclear stress test was reported to be abnormal suggestive of reversible ischemia.  She underwent coronary CTA and was found to have coronary artery calcification and minimal nonobstructive CAD.  She was started on medical therapy and was followed up 6 months later and was doing well overall.  She now presents for 1 year follow-up.  Over the last 1 year she has not had any anginal discomfort or heart failure symptoms.  No hospitalizations or urgent care visits.  Patient is compliant with her medical therapy.  No use of sublingual nitroglycerin tablets.  Review of systems are positive for dysphagia with certain types of foods.  I have asked her to follow-up with her PCP with regards to further management.  Since last office visit she did also get evaluated for sleep apnea and is currently on a CPAP.  She is doing well.  ALLERGIES: No Known Allergies  MEDICATION LIST PRIOR TO VISIT: Current Outpatient Medications on File Prior to Visit   Medication Sig Dispense Refill   amLODipine (NORVASC) 10 MG tablet Take 1 tablet (10 mg total) by mouth daily. 90 tablet 1   aspirin 81 MG EC tablet Take 81 mg by mouth daily. Swallow whole.     atorvastatin (LIPITOR) 20 MG tablet Take 1 tablet (20 mg total) by mouth at bedtime. 90 tablet 1   citalopram (CELEXA) 40 MG tablet Take 40 mg by mouth daily.     docusate sodium (COLACE) 100 MG capsule Take 100 mg by mouth as needed.     furosemide (LASIX) 20 MG tablet Take 0.5 tablets (10 mg total) by mouth in the morning. (Patient taking differently: Take 10 mg by mouth as needed.) 45 tablet 0   hydrochlorothiazide (HYDRODIURIL) 25 MG tablet Take 1 tablet (25 mg total) by mouth in the morning. 90 tablet 1   lisinopril (ZESTRIL) 40 MG tablet Take 1 tablet (40 mg total) by mouth daily. 90 tablet 1   meloxicam (MOBIC) 15 MG tablet Take 15 mg by mouth daily.     metoprolol succinate (TOPROL-XL) 50 MG 24 hr tablet TAKE ONE TABLET BY MOUTH EVERY DAY. HOLD IF TOP BLOOD PRESSURE NUMBER IS LESS THAN 100 OR HEART RATE IS LESS THAN 60 90 tablet 2   nitroGLYCERIN (NITROSTAT) 0.4 MG SL tablet Place 1 tablet (0.4 mg total) under the tongue every 5 (five) minutes as needed for chest pain. 90 tablet 3   traZODone (DESYREL) 100 MG tablet      No current facility-administered medications on file prior to visit.    PAST MEDICAL HISTORY: Past Medical History:  Diagnosis Date   Anxiety  Coronary artery calcification    Heart murmur    Hyperlipidemia    Hypertension    Nonobstructive atherosclerosis of coronary artery     PAST SURGICAL HISTORY: Past Surgical History:  Procedure Laterality Date   ACHILLES TENDON SURGERY  2010   knee replacement Right 2009   x3 2009, 2012, 2014   ROTATOR CUFF REPAIR  2011   Right   SPINAL FUSION     l3   VAGINAL HYSTERECTOMY  1986    FAMILY HISTORY: The patient family history includes Heart attack in her father; Heart failure in her father.   SOCIAL HISTORY:  The  patient  reports that she has never smoked. She has never used smokeless tobacco. She reports that she does not drink alcohol and does not use drugs.  Review of Systems  Constitutional: Negative for chills and fever.  HENT:  Negative for hoarse voice and nosebleeds.   Eyes:  Negative for discharge, double vision and pain.  Cardiovascular:  Negative for chest pain, claudication, dyspnea on exertion, leg swelling, near-syncope, orthopnea, palpitations, paroxysmal nocturnal dyspnea and syncope.  Respiratory:  Negative for hemoptysis and shortness of breath.   Musculoskeletal:  Negative for muscle cramps and myalgias.  Gastrointestinal:  Positive for dysphagia. Negative for abdominal pain, constipation, diarrhea, hematemesis, hematochezia, melena, nausea and vomiting.  Neurological:  Negative for dizziness and light-headedness.    PHYSICAL EXAM: Vitals with BMI 05/01/2021 05/02/2020 05/02/2020  Height _0  - _1   Weight 219 lbs - 217 lbs  BMI 16.10 - 96.04  Systolic 540 981 191  Diastolic 76 70 68  Pulse 63 69 69   CONSTITUTIONAL: Well-developed and well-nourished. No acute distress.  SKIN: Skin is warm and dry. No rash noted. No cyanosis. No pallor. No jaundice HEAD: Normocephalic and atraumatic.  EYES: No scleral icterus MOUTH/THROAT: Moist oral membranes.  NECK: No JVD present. No thyromegaly noted. No carotid bruits  LYMPHATIC: No visible cervical adenopathy.  CHEST Normal respiratory effort. No intercostal retractions  LUNGS: Clear to auscultation bilaterally.  No stridor. No wheezes. No rales.  CARDIOVASCULAR: Regular rate and rhythm, positive Y7-W2, soft systolic ejection murmur, no gallops or rubs. ABDOMINAL: Soft, nontender, nondistended, positive bowel sounds, no apparent ascites.  EXTREMITIES: No peripheral edema, warm to touch, 2+ dorsalis pedis and posterior tibial pulses bilaterally. HEMATOLOGIC: No significant bruising NEUROLOGIC: Oriented to person, place, and time.  Nonfocal. Normal muscle tone.  PSYCHIATRIC: Normal mood and affect. Normal behavior. Cooperative  CARDIAC DATABASE: EKG: 05/01/2021: Sinus bradycardia, 57 bpm, without underlying ischemia or injury pattern.  Echocardiogram: 07/06/2019: LVEF 55-60%, moderate left ventricular hypertrophy, indeterminate diastolic filling pattern, mild to moderately dilated left atrium, mild MAC, mild MR, mild TR.  Stress Testing:  Carlton Adam (Walking with mod Bruce)Tetrofosmin Stress Test  08/09/2019: Mild degree Moderate extent perfusion defect consistent with mild (reversible) ischemia located in the mid to distal lateral wall (Left Circumflex Artery region) of left ventricle. Overall LV systolic function is normal without regional wall motion abnormalities. Stress LV EF: 75%. Low risk.  Coronary CTA: 08/24/2019: Coronary Arteries:  Normal coronary origin.  Right dominance.  Left main: The left main is a short, large caliber vessel with a normal take off from the left coronary cusp that trifurcates to form a, ramus, and LCx.  There is no plaque or stenosis. LAD: The proximal LAD contains minimal non-calcified plaque (<25%). The mid LAD contains minimal non-calcified plaque (<25%). The distal LAD is patent. The LAD gives off 1 patent diagonal branch.  Ramus  intermedius: Small, patent vessel. LCx: The LCX is non-dominant. The proximal LCX contains minimal calcified plaque (<25%). The LCX gives off 2 patent obtuse marginal branches. RCA: dominant with normal take off from the right coronary cusp. The proximal RCA contains minimal calcified plaque (<25%). The mid and distal segments are patent without plaque or stenosis. The RCA terminates as a PDA and right posterolateral branch without evidence of plaque or stenosis. IMPRESSION: 1. Coronary calcium score of 248. This was 87th percentile for age and sex matched controls. 2. Normal coronary origin with right dominance.  3. Minimal, non-obstructive CAD (<25%).  4.  Small PFO.  5. Moderate mitral annular calcification.  RECOMMENDATIONS: Minimal non-obstructive CAD (0-24%). Consider preventive therapy and risk factor modification.  Heart Catheterization: None  LABORATORY DATA: External Labs: Collected:  Creatinine 0.77 mg/dL. eGFR: Greater than 60 mL/min per 1.73 m Lipid profile: Total cholesterol 208, triglycerides 102, HDL 59, LDL 129 TSH: 1.23   Lipid Panel     Component Value Date/Time   CHOL 126 10/26/2019 0820   TRIG 60 10/26/2019 0820   HDL 50 10/26/2019 0820   LDLCALC 63 10/26/2019 0820   LABVLDL 13 10/26/2019 0820   FINAL MEDICATION LIST END OF ENCOUNTER: No orders of the defined types were placed in this encounter.   There are no discontinued medications.    Current Outpatient Medications:    amLODipine (NORVASC) 10 MG tablet, Take 1 tablet (10 mg total) by mouth daily., Disp: 90 tablet, Rfl: 1   aspirin 81 MG EC tablet, Take 81 mg by mouth daily. Swallow whole., Disp: , Rfl:    atorvastatin (LIPITOR) 20 MG tablet, Take 1 tablet (20 mg total) by mouth at bedtime., Disp: 90 tablet, Rfl: 1   citalopram (CELEXA) 40 MG tablet, Take 40 mg by mouth daily., Disp: , Rfl:    docusate sodium (COLACE) 100 MG capsule, Take 100 mg by mouth as needed., Disp: , Rfl:    furosemide (LASIX) 20 MG tablet, Take 0.5 tablets (10 mg total) by mouth in the morning. (Patient taking differently: Take 10 mg by mouth as needed.), Disp: 45 tablet, Rfl: 0   hydrochlorothiazide (HYDRODIURIL) 25 MG tablet, Take 1 tablet (25 mg total) by mouth in the morning., Disp: 90 tablet, Rfl: 1   lisinopril (ZESTRIL) 40 MG tablet, Take 1 tablet (40 mg total) by mouth daily., Disp: 90 tablet, Rfl: 1   meloxicam (MOBIC) 15 MG tablet, Take 15 mg by mouth daily., Disp: , Rfl:    metoprolol succinate (TOPROL-XL) 50 MG 24 hr tablet, TAKE ONE TABLET BY MOUTH EVERY DAY. HOLD IF TOP BLOOD PRESSURE NUMBER IS LESS THAN 100 OR HEART RATE IS LESS THAN 60, Disp: 90 tablet, Rfl: 2    nitroGLYCERIN (NITROSTAT) 0.4 MG SL tablet, Place 1 tablet (0.4 mg total) under the tongue every 5 (five) minutes as needed for chest pain., Disp: 90 tablet, Rfl: 3   traZODone (DESYREL) 100 MG tablet, , Disp: , Rfl:   IMPRESSION:    ICD-10-CM   1. Nonobstructive atherosclerosis of coronary artery  I25.10     2. Coronary artery calcification seen on computed tomography  I25.10 EKG 12-Lead    3. Benign hypertension  I10     4. Mixed hyperlipidemia  E78.2     5. Class 2 obesity due to excess calories without serious comorbidity with body mass index (BMI) of 35.0 to 35.9 in adult  E66.09    Z68.35        RECOMMENDATIONS: Kimberly Patterson  Lamb is a 71 y.o. female whose past medical history and cardiac risk factors include: Moderate coronary artery calcification, nonobstructive CAD, obstructive sleep apnea on CPAP, benign essential hypertension, obesity, due to excess calories, postmenopausal female, advanced age.  Nonobstructive atherosclerosis of coronary artery /moderate coronary artery calcification:  Total CAC 248, 87 percentile Minimal nonobstructive CAD based on coronary CTA No reoccurrence of anginal discomfort. No use of sublingual nitroglycerin tablets. Patient has had recent ischemic work-up as well as an echocardiogram.  No additional testing warranted at this time. Patient's blood pressure and weight are relatively stable. She had labs with her PCP in July 2022-records requested.  Benign hypertension Office blood pressures are well controlled. Medications reconciled. Reemphasized the importance of CPAP compliance. Currently managed by primary care provider.  Mixed hyperlipidemia Currently on atorvastatin.   She denies myalgia or other side effects. Most recent lipids dated patient states that the most recent lipid profile was checked in July 2022.  Records requested.   Currently managed by primary care provider.  Class 2 obesity due to excess calories without serious  comorbidity with body mass index (BMI) of 35.0 to 35.9 in adult Body mass index is 35.35 kg/m. Weight has remained essentially stable since last office visit, 1 year ago. I reviewed with the patient the importance of diet, regular physical activity/exercise, weight loss.   Patient is educated on increasing physical activity gradually as tolerated.  With the goal of moderate intensity exercise for 30 minutes a day 5 days a week.   Orders Placed This Encounter  Procedures   EKG 12-Lead   --Continue cardiac medications as reconciled in final medication list. --Return in about 1 year (around 05/01/2022) for 1 year follow up Nonobstructive CAD. Or sooner if needed. --Continue follow-up with your primary care physician regarding the management of your other chronic comorbid conditions.  Patient's questions and concerns were addressed to her satisfaction. She voices understanding of the instructions provided during this encounter.   This note was created using a voice recognition software as a result there may be grammatical errors inadvertently enclosed that do not reflect the nature of this encounter. Every attempt is made to correct such errors.  Rex Kras, [place], [facility]  Pager: [phone] Office: [phone]

## 2021-06-11 NOTE — Progress Notes (Signed)
External Labs: Collected: 05/26/2021 provided by Dr. Thornton Papas Hemoglobin 14.6 g/dL, hematocrit 45.5% BUN 24, creatinine 0.83. Sodium 140, potassium 4.4, chloride 108, bicarb 30 AST 11, ALT 20, alkaline phosphatase 72. Total cholesterol 145, triglycerides 135, HDL 52, LDL 67 TSH 1.02

## 2022-03-11 ENCOUNTER — Telehealth: Payer: Self-pay

## 2022-03-11 ENCOUNTER — Encounter (HOSPITAL_COMMUNITY): Payer: Self-pay

## 2022-03-11 NOTE — Telephone Encounter (Signed)
Patients daughter stated that patient was recently put on Eliquis for Afib. Copay with her insurance and is requesting samples of Eliquis and patient assistance forms.  She will pick them up today.

## 2022-03-16 ENCOUNTER — Telehealth: Payer: Self-pay

## 2022-03-16 ENCOUNTER — Ambulatory Visit: Payer: Medicare Other

## 2022-03-16 VITALS — BP 113/81 | HR 130 | Resp 18 | Ht 66.0 in | Wt 222.4 lb

## 2022-03-16 DIAGNOSIS — I480 Paroxysmal atrial fibrillation: Secondary | ICD-10-CM

## 2022-03-16 MED ORDER — FLECAINIDE ACETATE 50 MG PO TABS: 50.0000 mg | ORAL_TABLET | Freq: Two times a day (BID) | ORAL | 3 refills | Status: DC

## 2022-03-16 NOTE — Telephone Encounter (Signed)
Ok, thank you

## 2022-03-16 NOTE — Progress Notes (Signed)
[name] Date of Birth: 1951-02-17 MRN: [id number] Primary Care Provider:[place], Council Mechanic, FNP Primary Cardiologist: Rex Kras, DO, [facility] (established care 06/30/2019)  Date: 03/16/22 Last Office Visit: 05/02/2020  Chief Complaint  Patient presents with   Atrial Fibrillation   HPI  Kimberly Lamb is a 71 y.o. female with past medical history and cardiac risk factors include: Moderate coronary artery calcification, nonobstructive CAD, obstructive sleep apnea on CPAP, benign essential hypertension, obesity, due to excess calories, postmenopausal female, advanced age.  Initially saw her in consultation back in March 2021 for evaluation of a cardiac murmur.  She presents today to follow-up after being seen in the emergency department on 03/11/2022 with complaints of left-sided back pain that radiates through left upper quadrant.  She was also mildly short of breath and noted that her apple smart watch reported atrial fibrillation constantly since October.  She was found to be in atrial fibrillation with rapid ventricular response which was rate controlled with IV metoprolol.  She did have elevated D-dimer in the setting of shortness of breath and underwent CT angio chest that was negative for pulmonary embolism.  She was started on Eliquis 5 mg twice daily.  She was seen by PCP this morning and diagnosed with disseminated herpes zoster and was again in atrial fibrillation with rapid ventricular response with heart rate of 136.  Overall, she is asymptomatic although she does report that family members and friends have noticed that she appears to be short of breath after minimal activity. She denies chest pain, palpitations, leg edema, orthopnea, PND, TIA/syncope.   ALLERGIES: No Known Allergies  MEDICATION LIST PRIOR TO VISIT: Current Outpatient Medications on File Prior to Visit  Medication Sig Dispense Refill   amLODipine (NORVASC) 10 MG tablet Take 1 tablet (10 mg total) by mouth daily. 90  tablet 1   apixaban (ELIQUIS) 5 MG TABS tablet Take 5 mg by mouth 2 (two) times daily.     aspirin 81 MG EC tablet Take 81 mg by mouth daily. Swallow whole.     atorvastatin (LIPITOR) 20 MG tablet Take 1 tablet (20 mg total) by mouth at bedtime. 90 tablet 1   ciprofloxacin (CIPRO) 500 MG tablet Take 500 mg by mouth 2 (two) times daily.     citalopram (CELEXA) 40 MG tablet Take 40 mg by mouth daily.     cyclobenzaprine (FLEXERIL) 5 MG tablet Take 5 mg by mouth 3 (three) times daily as needed for muscle spasms.     docusate sodium (COLACE) 100 MG capsule Take 100 mg by mouth as needed.     furosemide (LASIX) 20 MG tablet Take 0.5 tablets (10 mg total) by mouth in the morning. (Patient taking differently: Take 10 mg by mouth as needed.) 45 tablet 0   gabapentin (NEURONTIN) 100 MG capsule Take 100 mg by mouth 3 (three) times daily.     hydrochlorothiazide (HYDRODIURIL) 25 MG tablet Take 1 tablet (25 mg total) by mouth in the morning. 90 tablet 1   lisinopril (ZESTRIL) 40 MG tablet Take 1 tablet (40 mg total) by mouth daily. 90 tablet 1   meloxicam (MOBIC) 15 MG tablet Take 15 mg by mouth daily.     metoprolol succinate (TOPROL-XL) 50 MG 24 hr tablet TAKE ONE TABLET BY MOUTH EVERY DAY. HOLD IF TOP BLOOD PRESSURE NUMBER IS LESS THAN 100 OR HEART RATE IS LESS THAN 60 90 tablet 2   metroNIDAZOLE (FLAGYL) 500 MG tablet Take 500 mg by mouth 3 (three) times daily.  nitroGLYCERIN (NITROSTAT) 0.4 MG SL tablet Place 1 tablet (0.4 mg total) under the tongue every 5 (five) minutes as needed for chest pain. 90 tablet 3   pantoprazole (PROTONIX) 40 MG tablet Take 40 mg by mouth 2 (two) times daily.     promethazine (PHENERGAN) 25 MG suppository Place 25 mg rectally every 6 (six) hours as needed for nausea or vomiting.     traMADol (ULTRAM) 50 MG tablet Take 50 mg by mouth every 6 (six) hours as needed.     valACYclovir (VALTREX) 1000 MG tablet Take 1,000 mg by mouth 2 (two) times daily.     No current  facility-administered medications on file prior to visit.    PAST MEDICAL HISTORY: Past Medical History:  Diagnosis Date   Anxiety    Coronary artery calcification    Heart murmur    Hyperlipidemia    Hypertension    Nonobstructive atherosclerosis of coronary artery     PAST SURGICAL HISTORY: Past Surgical History:  Procedure Laterality Date   ACHILLES TENDON SURGERY  2010   knee replacement Right 2009   x3 2009, 2012, 2014   ROTATOR CUFF REPAIR  2011   Right   SPINAL FUSION     l3   VAGINAL HYSTERECTOMY  1986    FAMILY HISTORY: The patient family history includes Heart attack in her father; Heart failure in her father.   SOCIAL HISTORY:  The patient  reports that she has never smoked. She has never used smokeless tobacco. She reports that she does not drink alcohol and does not use drugs.  Review of Systems  Cardiovascular:  Negative for chest pain, claudication, dyspnea on exertion, irregular heartbeat, leg swelling, near-syncope, palpitations and syncope.  Respiratory:  Negative for shortness of breath.   Gastrointestinal:  Negative for melena.  Neurological:  Negative for dizziness and light-headedness.     PHYSICAL EXAM:    03/16/2022    3:10 PM 05/01/2021    9:39 AM 05/02/2020   10:18 AM  Vitals with BMI  Height _0  _1    Weight 222 lbs 6 oz 219 lbs   BMI 25.95 63.87   Systolic 564 332 951  Diastolic 81 76 70  Pulse 884 63 69   Physical Exam Cardiovascular:     Rate and Rhythm: Tachycardia present. Rhythm irregular.     Pulses:          Carotid pulses are 2+ on the right side and 2+ on the left side.      Radial pulses are 3+ on the right side and 3+ on the left side.       Dorsalis pedis pulses are 2+ on the right side and 2+ on the left side.     Heart sounds: No murmur heard.    No gallop.  Pulmonary:     Effort: Pulmonary effort is normal. No respiratory distress.     Breath sounds: Normal breath sounds. No wheezing or rales.   Musculoskeletal:     Right lower leg: No edema.     Left lower leg: No edema.     CARDIAC DATABASE: EKG: 03/16/2022 (completed at PCP): Atrial fibrillation with rapid ventricular response at 136.  Left axis deviation.  Echocardiogram: 07/06/2019: LVEF 55-60%, moderate left ventricular hypertrophy, indeterminate diastolic filling pattern, mild to moderately dilated left atrium, mild MAC, mild MR, mild TR.  Stress Testing:  Lexiscan (Walking with mod Bruce)Tetrofosmin Stress Test  08/09/2019: Mild degree Moderate extent perfusion defect consistent with mild (reversible)  ischemia located in the mid to distal lateral wall (Left Circumflex Artery region) of left ventricle. Overall LV systolic function is normal without regional wall motion abnormalities. Stress LV EF: 75%. Low risk.  Coronary CTA: 08/24/2019:  IMPRESSION: 1. Coronary calcium score of 248. This was 87th percentile for age and sex matched controls. 2. Normal coronary origin with right dominance.  3. Minimal, non-obstructive CAD (<25%).  4. Small PFO.  5. Moderate mitral annular calcification.  RECOMMENDATIONS: Minimal non-obstructive CAD (0-24%). Consider preventive therapy and risk factor modification.  Heart Catheterization: None  LABORATORY DATA: External Labs: Collected:  Creatinine 0.77 mg/dL. eGFR: Greater than 60 mL/min per 1.73 m Lipid profile: Total cholesterol 208, triglycerides 102, HDL 59, LDL 129 TSH: 1.23   Lipid Panel     Component Value Date/Time   CHOL 126 10/26/2019 0820   TRIG 60 10/26/2019 0820   HDL 50 10/26/2019 0820   LDLCALC 63 10/26/2019 0820   LABVLDL 13 10/26/2019 0820   FINAL MEDICATION LIST END OF ENCOUNTER: Meds ordered this encounter  Medications   flecainide (TAMBOCOR) 50 MG tablet    Sig: Take 1 tablet (50 mg total) by mouth 2 (two) times daily.    Dispense:  180 tablet    Refill:  3    Order Specific Question:   Supervising Provider    Answer:   Adrian Prows [[id number]]     Medications Discontinued During This Encounter  Medication Reason   traZODone (DESYREL) 100 MG tablet       Current Outpatient Medications:    amLODipine (NORVASC) 10 MG tablet, Take 1 tablet (10 mg total) by mouth daily., Disp: 90 tablet, Rfl: 1   apixaban (ELIQUIS) 5 MG TABS tablet, Take 5 mg by mouth 2 (two) times daily., Disp: , Rfl:    aspirin 81 MG EC tablet, Take 81 mg by mouth daily. Swallow whole., Disp: , Rfl:    atorvastatin (LIPITOR) 20 MG tablet, Take 1 tablet (20 mg total) by mouth at bedtime., Disp: 90 tablet, Rfl: 1   ciprofloxacin (CIPRO) 500 MG tablet, Take 500 mg by mouth 2 (two) times daily., Disp: , Rfl:    citalopram (CELEXA) 40 MG tablet, Take 40 mg by mouth daily., Disp: , Rfl:    cyclobenzaprine (FLEXERIL) 5 MG tablet, Take 5 mg by mouth 3 (three) times daily as needed for muscle spasms., Disp: , Rfl:    docusate sodium (COLACE) 100 MG capsule, Take 100 mg by mouth as needed., Disp: , Rfl:    flecainide (TAMBOCOR) 50 MG tablet, Take 1 tablet (50 mg total) by mouth 2 (two) times daily., Disp: 180 tablet, Rfl: 3   furosemide (LASIX) 20 MG tablet, Take 0.5 tablets (10 mg total) by mouth in the morning. (Patient taking differently: Take 10 mg by mouth as needed.), Disp: 45 tablet, Rfl: 0   gabapentin (NEURONTIN) 100 MG capsule, Take 100 mg by mouth 3 (three) times daily., Disp: , Rfl:    hydrochlorothiazide (HYDRODIURIL) 25 MG tablet, Take 1 tablet (25 mg total) by mouth in the morning., Disp: 90 tablet, Rfl: 1   lisinopril (ZESTRIL) 40 MG tablet, Take 1 tablet (40 mg total) by mouth daily., Disp: 90 tablet, Rfl: 1   meloxicam (MOBIC) 15 MG tablet, Take 15 mg by mouth daily., Disp: , Rfl:    metoprolol succinate (TOPROL-XL) 50 MG 24 hr tablet, TAKE ONE TABLET BY MOUTH EVERY DAY. HOLD IF TOP BLOOD PRESSURE NUMBER IS LESS THAN 100 OR HEART RATE IS LESS THAN  60, Disp: 90 tablet, Rfl: 2   metroNIDAZOLE (FLAGYL) 500 MG tablet, Take 500 mg by mouth 3 (three) times daily., Disp:  , Rfl:    nitroGLYCERIN (NITROSTAT) 0.4 MG SL tablet, Place 1 tablet (0.4 mg total) under the tongue every 5 (five) minutes as needed for chest pain., Disp: 90 tablet, Rfl: 3   pantoprazole (PROTONIX) 40 MG tablet, Take 40 mg by mouth 2 (two) times daily., Disp: , Rfl:    promethazine (PHENERGAN) 25 MG suppository, Place 25 mg rectally every 6 (six) hours as needed for nausea or vomiting., Disp: , Rfl:    traMADol (ULTRAM) 50 MG tablet, Take 50 mg by mouth every 6 (six) hours as needed., Disp: , Rfl:    valACYclovir (VALTREX) 1000 MG tablet, Take 1,000 mg by mouth 2 (two) times daily., Disp: , Rfl:   IMPRESSION:    ICD-10-CM   1. Paroxysmal atrial fibrillation (HCC)  I48.0         RECOMMENDATIONS: Sherlyn Ebbert is a 71 y.o. female whose past medical history and cardiac risk factors include: Moderate coronary artery calcification, nonobstructive CAD, obstructive sleep apnea on CPAP, benign essential hypertension, obesity, due to excess calories, postmenopausal female, advanced age.  Paroxysmal atrial fibrillation (HCC) Given nonobstructive CAD and no significant valvular disease on previous echocardiogram we will start flecainide 50 mg twice daily.  Continue Eliquis 5 mg twice daily. Continue metoprolol 50 mg daily.  Advised patient that if heart rate remains elevated she may take 100 mg. Of note, although patient does not feel short of breath she is visibly dyspneic and had trouble catching her breath even when going from a laying to sitting position during physical exam. We will plan to follow-up in 1 week or sooner if needed.  If she remains in A-fib with rapid ventricular response would consider cardioversion.   No orders of the defined types were placed in this encounter.  --Continue cardiac medications as reconciled in final medication list. --Return in about 1 week (around 03/23/2022) for Afib. Or sooner if needed. --Continue follow-up with your primary care physician regarding the  management of your other chronic comorbid conditions.  Patient's questions and concerns were addressed to her satisfaction. She voices understanding of the instructions provided during this encounter.   This note was created using a voice recognition software as a result there may be grammatical errors inadvertently enclosed that do not reflect the nature of this encounter. Every attempt is made to correct such errors.   Ernst Spell, [place] Office: [phone] Pager: [phone]

## 2022-03-16 NOTE — Telephone Encounter (Signed)
Patient is calling after going to the ER on Tuesday night. She was diagnosed with A-fib and diverticulitis, they were able to lower her heart rate some but per her watch is still in A-fib and her HR is back elevated. She also has Shingles, which began on Saturday.   She is going to come in this afternoon.

## 2022-03-24 ENCOUNTER — Ambulatory Visit: Payer: Medicare Other

## 2022-03-25 ENCOUNTER — Encounter: Payer: Self-pay | Admitting: Internal Medicine

## 2022-03-25 ENCOUNTER — Ambulatory Visit: Payer: Medicare Other

## 2022-03-25 ENCOUNTER — Ambulatory Visit: Payer: Medicare Other | Admitting: Internal Medicine

## 2022-03-25 VITALS — BP 132/79 | HR 90 | Ht 66.0 in | Wt 226.6 lb

## 2022-03-25 DIAGNOSIS — I480 Paroxysmal atrial fibrillation: Secondary | ICD-10-CM

## 2022-03-25 MED ORDER — METOPROLOL SUCCINATE ER 100 MG PO TB24: 100.0000 mg | ORAL_TABLET | Freq: Two times a day (BID) | ORAL | 6 refills | Status: DC

## 2022-03-25 NOTE — H&P (View-Only) (Signed)
Kimberly Lamb Date of Birth: 08/01/1950 MRN: [id number] Primary Care Provider:Stanfield, Council Mechanic, FNP Primary Cardiologist: Rex Kras, DO, [facility] (established care 06/30/2019)  Date: 03/25/22 Last Office Visit: 05/02/2020  Chief Complaint  Patient presents with   Atrial Fibrillation   Follow-up   HPI  Kimberly Lamb is a 71 y.o. female with past medical history and cardiac risk factors include: Moderate coronary artery calcification, nonobstructive CAD, obstructive sleep apnea on CPAP, benign essential hypertension, obesity, due to excess calories, postmenopausal female, advanced age.  Initially saw her in consultation back in March 2021 for evaluation of a cardiac murmur.  She presents today to follow-up for new onset symptomatic atrial fibrillation. Patient still in Afib today and quite symptomatic. She is short of breath even with conversation. Patient would like to proceed with cardioversion as she feels very weak and tired being in sustained Afib. She denies chest pain, palpitations, leg edema, orthopnea, PND, TIA/syncope.   ALLERGIES: No Known Allergies  MEDICATION LIST PRIOR TO VISIT: Current Outpatient Medications on File Prior to Visit  Medication Sig Dispense Refill   apixaban (ELIQUIS) 5 MG TABS tablet Take 5 mg by mouth 2 (two) times daily.     aspirin 81 MG EC tablet Take 81 mg by mouth daily. Swallow whole.     atorvastatin (LIPITOR) 20 MG tablet Take 1 tablet (20 mg total) by mouth at bedtime. 90 tablet 1   ciprofloxacin (CIPRO) 500 MG tablet Take 500 mg by mouth 2 (two) times daily.     citalopram (CELEXA) 40 MG tablet Take 40 mg by mouth daily.     cyclobenzaprine (FLEXERIL) 5 MG tablet Take 5 mg by mouth 3 (three) times daily as needed for muscle spasms.     dicyclomine (BENTYL) 20 MG tablet Take 20 mg by mouth as needed.     docusate sodium (COLACE) 100 MG capsule Take 100 mg by mouth as needed.     flecainide (TAMBOCOR) 50 MG tablet Take 1 tablet (50 mg total) by  mouth 2 (two) times daily. 180 tablet 3   gabapentin (NEURONTIN) 100 MG capsule Take 100 mg by mouth 3 (three) times daily.     meloxicam (MOBIC) 15 MG tablet Take 15 mg by mouth daily.     metoprolol succinate (TOPROL-XL) 50 MG 24 hr tablet TAKE ONE TABLET BY MOUTH EVERY DAY. HOLD IF TOP BLOOD PRESSURE NUMBER IS LESS THAN 100 OR HEART RATE IS LESS THAN 60 90 tablet 2   metroNIDAZOLE (FLAGYL) 500 MG tablet Take 500 mg by mouth 3 (three) times daily.     pantoprazole (PROTONIX) 40 MG tablet Take 40 mg by mouth 2 (two) times daily.     promethazine (PHENERGAN) 25 MG suppository Place 25 mg rectally every 6 (six) hours as needed for nausea or vomiting.     traMADol (ULTRAM) 50 MG tablet Take 50 mg by mouth every 6 (six) hours as needed.     valACYclovir (VALTREX) 1000 MG tablet Take 1,000 mg by mouth 2 (two) times daily.     amLODipine (NORVASC) 10 MG tablet Take 1 tablet (10 mg total) by mouth daily. 90 tablet 1   furosemide (LASIX) 20 MG tablet Take 0.5 tablets (10 mg total) by mouth in the morning. (Patient taking differently: Take 10 mg by mouth as needed.) 45 tablet 0   hydrochlorothiazide (HYDRODIURIL) 25 MG tablet Take 1 tablet (25 mg total) by mouth in the morning. 90 tablet 1   lisinopril (ZESTRIL) 40 MG tablet Take 1 tablet (  40 mg total) by mouth daily. 90 tablet 1   nitroGLYCERIN (NITROSTAT) 0.4 MG SL tablet Place 1 tablet (0.4 mg total) under the tongue every 5 (five) minutes as needed for chest pain. 90 tablet 3   No current facility-administered medications on file prior to visit.    PAST MEDICAL HISTORY: Past Medical History:  Diagnosis Date   Anxiety    Coronary artery calcification    Heart murmur    Hyperlipidemia    Hypertension    Nonobstructive atherosclerosis of coronary artery     PAST SURGICAL HISTORY: Past Surgical History:  Procedure Laterality Date   ACHILLES TENDON SURGERY  2010   knee replacement Right 2009   x3 2009, 2012, 2014   ROTATOR CUFF REPAIR  2011    Right   SPINAL FUSION     l3   VAGINAL HYSTERECTOMY  1986    FAMILY HISTORY: The patient family history includes Heart attack in her father; Heart failure in her father.   SOCIAL HISTORY:  The patient  reports that she has never smoked. She has never used smokeless tobacco. She reports that she does not drink alcohol and does not use drugs.  Review of Systems  Cardiovascular:  Positive for dyspnea on exertion. Negative for chest pain, claudication, irregular heartbeat, leg swelling, near-syncope, palpitations and syncope.  Respiratory:  Positive for shortness of breath.   Gastrointestinal:  Negative for melena.  Neurological:  Negative for dizziness and light-headedness.     PHYSICAL EXAM:    03/25/2022    2:48 PM 03/16/2022    3:10 PM 05/01/2021    9:39 AM  Vitals with BMI  Height _0  _1  _2   Weight 226 lbs 10 oz 222 lbs 6 oz 219 lbs  BMI 36.59 29.56 21.30  Systolic 865 784 696  Diastolic 79 81 76  Pulse 90 130 63   Physical Exam Cardiovascular:     Rate and Rhythm: Tachycardia present. Rhythm irregular.     Pulses:          Carotid pulses are 2+ on the right side and 2+ on the left side.      Radial pulses are 3+ on the right side and 3+ on the left side.       Dorsalis pedis pulses are 2+ on the right side and 2+ on the left side.     Heart sounds: No murmur heard.    No gallop.  Pulmonary:     Effort: Pulmonary effort is normal. No respiratory distress.     Breath sounds: Normal breath sounds. No wheezing or rales.  Musculoskeletal:     Right lower leg: No edema.     Left lower leg: No edema.     CARDIAC DATABASE: EKG: 03/16/2022 (completed at PCP): Atrial fibrillation with rapid ventricular response at 136.  Left axis deviation. 03/25/2022: atrial fibrillation with RVR, 118 bpm. No change compared to prior  Echocardiogram: 07/06/2019: LVEF 55-60%, moderate left ventricular hypertrophy, indeterminate diastolic filling pattern, mild to moderately  dilated left atrium, mild MAC, mild MR, mild TR.  Stress Testing:  Carlton Adam (Walking with mod Bruce)Tetrofosmin Stress Test  08/09/2019: Mild degree Moderate extent perfusion defect consistent with mild (reversible) ischemia located in the mid to distal lateral wall (Left Circumflex Artery region) of left ventricle. Overall LV systolic function is normal without regional wall motion abnormalities. Stress LV EF: 75%. Low risk.  Coronary CTA: 08/24/2019:  IMPRESSION: 1. Coronary calcium score of 248. This was 87th percentile  for age and sex matched controls. 2. Normal coronary origin with right dominance.  3. Minimal, non-obstructive CAD (<25%).  4. Small PFO.  5. Moderate mitral annular calcification.  RECOMMENDATIONS: Minimal non-obstructive CAD (0-24%). Consider preventive therapy and risk factor modification.  Heart Catheterization: None  LABORATORY DATA: External Labs: Collected:  Creatinine 0.77 mg/dL. eGFR: Greater than 60 mL/min per 1.73 m Lipid profile: Total cholesterol 208, triglycerides 102, HDL 59, LDL 129 TSH: 1.23   Lipid Panel     Component Value Date/Time   CHOL 126 10/26/2019 0820   TRIG 60 10/26/2019 0820   HDL 50 10/26/2019 0820   LDLCALC 63 10/26/2019 0820   LABVLDL 13 10/26/2019 0820   FINAL MEDICATION LIST END OF ENCOUNTER: No orders of the defined types were placed in this encounter.   There are no discontinued medications.     Current Outpatient Medications:    apixaban (ELIQUIS) 5 MG TABS tablet, Take 5 mg by mouth 2 (two) times daily., Disp: , Rfl:    aspirin 81 MG EC tablet, Take 81 mg by mouth daily. Swallow whole., Disp: , Rfl:    atorvastatin (LIPITOR) 20 MG tablet, Take 1 tablet (20 mg total) by mouth at bedtime., Disp: 90 tablet, Rfl: 1   ciprofloxacin (CIPRO) 500 MG tablet, Take 500 mg by mouth 2 (two) times daily., Disp: , Rfl:    citalopram (CELEXA) 40 MG tablet, Take 40 mg by mouth daily., Disp: , Rfl:    cyclobenzaprine (FLEXERIL) 5  MG tablet, Take 5 mg by mouth 3 (three) times daily as needed for muscle spasms., Disp: , Rfl:    dicyclomine (BENTYL) 20 MG tablet, Take 20 mg by mouth as needed., Disp: , Rfl:    docusate sodium (COLACE) 100 MG capsule, Take 100 mg by mouth as needed., Disp: , Rfl:    flecainide (TAMBOCOR) 50 MG tablet, Take 1 tablet (50 mg total) by mouth 2 (two) times daily., Disp: 180 tablet, Rfl: 3   gabapentin (NEURONTIN) 100 MG capsule, Take 100 mg by mouth 3 (three) times daily., Disp: , Rfl:    meloxicam (MOBIC) 15 MG tablet, Take 15 mg by mouth daily., Disp: , Rfl:    metoprolol succinate (TOPROL-XL) 50 MG 24 hr tablet, TAKE ONE TABLET BY MOUTH EVERY DAY. HOLD IF TOP BLOOD PRESSURE NUMBER IS LESS THAN 100 OR HEART RATE IS LESS THAN 60, Disp: 90 tablet, Rfl: 2   metroNIDAZOLE (FLAGYL) 500 MG tablet, Take 500 mg by mouth 3 (three) times daily., Disp: , Rfl:    pantoprazole (PROTONIX) 40 MG tablet, Take 40 mg by mouth 2 (two) times daily., Disp: , Rfl:    promethazine (PHENERGAN) 25 MG suppository, Place 25 mg rectally every 6 (six) hours as needed for nausea or vomiting., Disp: , Rfl:    traMADol (ULTRAM) 50 MG tablet, Take 50 mg by mouth every 6 (six) hours as needed., Disp: , Rfl:    valACYclovir (VALTREX) 1000 MG tablet, Take 1,000 mg by mouth 2 (two) times daily., Disp: , Rfl:    amLODipine (NORVASC) 10 MG tablet, Take 1 tablet (10 mg total) by mouth daily., Disp: 90 tablet, Rfl: 1   furosemide (LASIX) 20 MG tablet, Take 0.5 tablets (10 mg total) by mouth in the morning. (Patient taking differently: Take 10 mg by mouth as needed.), Disp: 45 tablet, Rfl: 0   hydrochlorothiazide (HYDRODIURIL) 25 MG tablet, Take 1 tablet (25 mg total) by mouth in the morning., Disp: 90 tablet, Rfl: 1  lisinopril (ZESTRIL) 40 MG tablet, Take 1 tablet (40 mg total) by mouth daily., Disp: 90 tablet, Rfl: 1   nitroGLYCERIN (NITROSTAT) 0.4 MG SL tablet, Place 1 tablet (0.4 mg total) under the tongue every 5 (five) minutes as  needed for chest pain., Disp: 90 tablet, Rfl: 3  IMPRESSION:    ICD-10-CM   1. Paroxysmal atrial fibrillation (HCC)  I48.0 EKG 12-Lead        RECOMMENDATIONS: Kimberly Lamb is a 71 y.o. female whose past medical history and cardiac risk factors include: Moderate coronary artery calcification, nonobstructive CAD, obstructive sleep apnea on CPAP, benign essential hypertension, obesity, due to excess calories, postmenopausal female, advanced age.  Paroxysmal atrial fibrillation (HCC) Continue flecainide 50 mg twice daily.  Continue Eliquis 5 mg twice daily. Will increase Toprol to 166m BID [facility] and she is very symptomatic when in Afib Patient would like to proceed with cardioversion  Follow-up in 2 weeks or sooner if needed.   Orders Placed This Encounter  Procedures   EKG 12-Lead       SFloydene Flock DO, FDecatur County HospitalOffice: [phone]: [phone]

## 2022-03-25 NOTE — Progress Notes (Signed)
Kimberly Lamb Date of Birth: 07/10/1950 MRN: [id number] Primary Care Provider:Stanfield, Amy L, FNP Primary Cardiologist: Sunit Tolia, DO, FACC (established care 06/30/2019)  Date: 03/25/22 Last Office Visit: 05/02/2020  Chief Complaint  Patient presents with   Atrial Fibrillation   Follow-up   HPI  Kimberly Lamb is a 71 y.o. female with past medical history and cardiac risk factors include: Moderate coronary artery calcification, nonobstructive CAD, obstructive sleep apnea on CPAP, benign essential hypertension, obesity, due to excess calories, postmenopausal female, advanced age.  Initially saw her in consultation back in March 2021 for evaluation of a cardiac murmur.  She presents today to follow-up for new onset symptomatic atrial fibrillation. Patient still in Afib today and quite symptomatic. She is short of breath even with conversation. Patient would like to proceed with cardioversion as she feels very weak and tired being in sustained Afib. She denies chest pain, palpitations, leg edema, orthopnea, PND, TIA/syncope.   ALLERGIES: No Known Allergies  MEDICATION LIST PRIOR TO VISIT: Current Outpatient Medications on File Prior to Visit  Medication Sig Dispense Refill   apixaban (ELIQUIS) 5 MG TABS tablet Take 5 mg by mouth 2 (two) times daily.     aspirin 81 MG EC tablet Take 81 mg by mouth daily. Swallow whole.     atorvastatin (LIPITOR) 20 MG tablet Take 1 tablet (20 mg total) by mouth at bedtime. 90 tablet 1   ciprofloxacin (CIPRO) 500 MG tablet Take 500 mg by mouth 2 (two) times daily.     citalopram (CELEXA) 40 MG tablet Take 40 mg by mouth daily.     cyclobenzaprine (FLEXERIL) 5 MG tablet Take 5 mg by mouth 3 (three) times daily as needed for muscle spasms.     dicyclomine (BENTYL) 20 MG tablet Take 20 mg by mouth as needed.     docusate sodium (COLACE) 100 MG capsule Take 100 mg by mouth as needed.     flecainide (TAMBOCOR) 50 MG tablet Take 1 tablet (50 mg total) by  mouth 2 (two) times daily. 180 tablet 3   gabapentin (NEURONTIN) 100 MG capsule Take 100 mg by mouth 3 (three) times daily.     meloxicam (MOBIC) 15 MG tablet Take 15 mg by mouth daily.     metoprolol succinate (TOPROL-XL) 50 MG 24 hr tablet TAKE ONE TABLET BY MOUTH EVERY DAY. HOLD IF TOP BLOOD PRESSURE NUMBER IS LESS THAN 100 OR HEART RATE IS LESS THAN 60 90 tablet 2   metroNIDAZOLE (FLAGYL) 500 MG tablet Take 500 mg by mouth 3 (three) times daily.     pantoprazole (PROTONIX) 40 MG tablet Take 40 mg by mouth 2 (two) times daily.     promethazine (PHENERGAN) 25 MG suppository Place 25 mg rectally every 6 (six) hours as needed for nausea or vomiting.     traMADol (ULTRAM) 50 MG tablet Take 50 mg by mouth every 6 (six) hours as needed.     valACYclovir (VALTREX) 1000 MG tablet Take 1,000 mg by mouth 2 (two) times daily.     amLODipine (NORVASC) 10 MG tablet Take 1 tablet (10 mg total) by mouth daily. 90 tablet 1   furosemide (LASIX) 20 MG tablet Take 0.5 tablets (10 mg total) by mouth in the morning. (Patient taking differently: Take 10 mg by mouth as needed.) 45 tablet 0   hydrochlorothiazide (HYDRODIURIL) 25 MG tablet Take 1 tablet (25 mg total) by mouth in the morning. 90 tablet 1   lisinopril (ZESTRIL) 40 MG tablet Take 1 tablet (  40 mg total) by mouth daily. 90 tablet 1   nitroGLYCERIN (NITROSTAT) 0.4 MG SL tablet Place 1 tablet (0.4 mg total) under the tongue every 5 (five) minutes as needed for chest pain. 90 tablet 3   No current facility-administered medications on file prior to visit.    PAST MEDICAL HISTORY: Past Medical History:  Diagnosis Date   Anxiety    Coronary artery calcification    Heart murmur    Hyperlipidemia    Hypertension    Nonobstructive atherosclerosis of coronary artery     PAST SURGICAL HISTORY: Past Surgical History:  Procedure Laterality Date   ACHILLES TENDON SURGERY  2010   knee replacement Right 2009   x3 2009, 2012, 2014   ROTATOR CUFF REPAIR  2011    Right   SPINAL FUSION     l3   VAGINAL HYSTERECTOMY  1986    FAMILY HISTORY: The patient family history includes Heart attack in her father; Heart failure in her father.   SOCIAL HISTORY:  The patient  reports that she has never smoked. She has never used smokeless tobacco. She reports that she does not drink alcohol and does not use drugs.  Review of Systems  Cardiovascular:  Positive for dyspnea on exertion. Negative for chest pain, claudication, irregular heartbeat, leg swelling, near-syncope, palpitations and syncope.  Respiratory:  Positive for shortness of breath.   Gastrointestinal:  Negative for melena.  Neurological:  Negative for dizziness and light-headedness.     PHYSICAL EXAM:    03/25/2022    2:48 PM 03/16/2022    3:10 PM 05/01/2021    9:39 AM  Vitals with BMI  Height 5' 6" 5' 6" 5' 6"  Weight 226 lbs 10 oz 222 lbs 6 oz 219 lbs  BMI 36.59 35.91 35.36  Systolic 132 113 126  Diastolic 79 81 76  Pulse 90 130 63   Physical Exam Cardiovascular:     Rate and Rhythm: Tachycardia present. Rhythm irregular.     Pulses:          Carotid pulses are 2+ on the right side and 2+ on the left side.      Radial pulses are 3+ on the right side and 3+ on the left side.       Dorsalis pedis pulses are 2+ on the right side and 2+ on the left side.     Heart sounds: No murmur heard.    No gallop.  Pulmonary:     Effort: Pulmonary effort is normal. No respiratory distress.     Breath sounds: Normal breath sounds. No wheezing or rales.  Musculoskeletal:     Right lower leg: No edema.     Left lower leg: No edema.     CARDIAC DATABASE: EKG: 03/16/2022 (completed at PCP): Atrial fibrillation with rapid ventricular response at 136.  Left axis deviation. 03/25/2022: atrial fibrillation with RVR, 118 bpm. No change compared to prior  Echocardiogram: 07/06/2019: LVEF 55-60%, moderate left ventricular hypertrophy, indeterminate diastolic filling pattern, mild to moderately  dilated left atrium, mild MAC, mild MR, mild TR.  Stress Testing:  Lexiscan (Walking with mod Bruce)Tetrofosmin Stress Test  08/09/2019: Mild degree Moderate extent perfusion defect consistent with mild (reversible) ischemia located in the mid to distal lateral wall (Left Circumflex Artery region) of left ventricle. Overall LV systolic function is normal without regional wall motion abnormalities. Stress LV EF: 75%. Low risk.  Coronary CTA: 08/24/2019:  IMPRESSION: 1. Coronary calcium score of 248. This was 87th percentile   for age and sex matched controls. 2. Normal coronary origin with right dominance.  3. Minimal, non-obstructive CAD (<25%).  4. Small PFO.  5. Moderate mitral annular calcification.  RECOMMENDATIONS: Minimal non-obstructive CAD (0-24%). Consider preventive therapy and risk factor modification.  Heart Catheterization: None  LABORATORY DATA: External Labs: Collected:  Creatinine 0.77 mg/dL. eGFR: Greater than 60 mL/min per 1.73 m Lipid profile: Total cholesterol 208, triglycerides 102, HDL 59, LDL 129 TSH: 1.23   Lipid Panel     Component Value Date/Time   CHOL 126 10/26/2019 0820   TRIG 60 10/26/2019 0820   HDL 50 10/26/2019 0820   LDLCALC 63 10/26/2019 0820   LABVLDL 13 10/26/2019 0820   FINAL MEDICATION LIST END OF ENCOUNTER: No orders of the defined types were placed in this encounter.   There are no discontinued medications.     Current Outpatient Medications:    apixaban (ELIQUIS) 5 MG TABS tablet, Take 5 mg by mouth 2 (two) times daily., Disp: , Rfl:    aspirin 81 MG EC tablet, Take 81 mg by mouth daily. Swallow whole., Disp: , Rfl:    atorvastatin (LIPITOR) 20 MG tablet, Take 1 tablet (20 mg total) by mouth at bedtime., Disp: 90 tablet, Rfl: 1   ciprofloxacin (CIPRO) 500 MG tablet, Take 500 mg by mouth 2 (two) times daily., Disp: , Rfl:    citalopram (CELEXA) 40 MG tablet, Take 40 mg by mouth daily., Disp: , Rfl:    cyclobenzaprine (FLEXERIL) 5  MG tablet, Take 5 mg by mouth 3 (three) times daily as needed for muscle spasms., Disp: , Rfl:    dicyclomine (BENTYL) 20 MG tablet, Take 20 mg by mouth as needed., Disp: , Rfl:    docusate sodium (COLACE) 100 MG capsule, Take 100 mg by mouth as needed., Disp: , Rfl:    flecainide (TAMBOCOR) 50 MG tablet, Take 1 tablet (50 mg total) by mouth 2 (two) times daily., Disp: 180 tablet, Rfl: 3   gabapentin (NEURONTIN) 100 MG capsule, Take 100 mg by mouth 3 (three) times daily., Disp: , Rfl:    meloxicam (MOBIC) 15 MG tablet, Take 15 mg by mouth daily., Disp: , Rfl:    metoprolol succinate (TOPROL-XL) 50 MG 24 hr tablet, TAKE ONE TABLET BY MOUTH EVERY DAY. HOLD IF TOP BLOOD PRESSURE NUMBER IS LESS THAN 100 OR HEART RATE IS LESS THAN 60, Disp: 90 tablet, Rfl: 2   metroNIDAZOLE (FLAGYL) 500 MG tablet, Take 500 mg by mouth 3 (three) times daily., Disp: , Rfl:    pantoprazole (PROTONIX) 40 MG tablet, Take 40 mg by mouth 2 (two) times daily., Disp: , Rfl:    promethazine (PHENERGAN) 25 MG suppository, Place 25 mg rectally every 6 (six) hours as needed for nausea or vomiting., Disp: , Rfl:    traMADol (ULTRAM) 50 MG tablet, Take 50 mg by mouth every 6 (six) hours as needed., Disp: , Rfl:    valACYclovir (VALTREX) 1000 MG tablet, Take 1,000 mg by mouth 2 (two) times daily., Disp: , Rfl:    amLODipine (NORVASC) 10 MG tablet, Take 1 tablet (10 mg total) by mouth daily., Disp: 90 tablet, Rfl: 1   furosemide (LASIX) 20 MG tablet, Take 0.5 tablets (10 mg total) by mouth in the morning. (Patient taking differently: Take 10 mg by mouth as needed.), Disp: 45 tablet, Rfl: 0   hydrochlorothiazide (HYDRODIURIL) 25 MG tablet, Take 1 tablet (25 mg total) by mouth in the morning., Disp: 90 tablet, Rfl: 1     lisinopril (ZESTRIL) 40 MG tablet, Take 1 tablet (40 mg total) by mouth daily., Disp: 90 tablet, Rfl: 1   nitroGLYCERIN (NITROSTAT) 0.4 MG SL tablet, Place 1 tablet (0.4 mg total) under the tongue every 5 (five) minutes as  needed for chest pain., Disp: 90 tablet, Rfl: 3  IMPRESSION:    ICD-10-CM   1. Paroxysmal atrial fibrillation (HCC)  I48.0 EKG 12-Lead        RECOMMENDATIONS: Kimberly Lamb is a 71 y.o. female whose past medical history and cardiac risk factors include: Moderate coronary artery calcification, nonobstructive CAD, obstructive sleep apnea on CPAP, benign essential hypertension, obesity, due to excess calories, postmenopausal female, advanced age.  Paroxysmal atrial fibrillation (HCC) Continue flecainide 50 mg twice daily.  Continue Eliquis 5 mg twice daily. Will increase Toprol to 100mg BID Schedule TEE with [facility] at [facility] with Dr Tolia (pt only on OAC for about 2 weeks now) and she is very symptomatic when in Afib Patient would like to proceed with cardioversion  Follow-up in 2 weeks or sooner if needed.   Orders Placed This Encounter  Procedures   EKG 12-Lead       Alyia Lacerte, DO, FACC Office: [phone] Pager: [phone]  

## 2022-04-21 NOTE — Anesthesia Preprocedure Evaluation (Signed)
Anesthesia Evaluation  Patient identified by MRN, date of birth, ID band Patient awake    Reviewed: Allergy & Precautions, H&P , Patient's Chart, lab work & pertinent test results  Airway Mallampati: III  TM Distance: >3 FB Neck ROM: Full    Dental  (+) Teeth Intact, Dental Advisory Given   Pulmonary neg pulmonary ROS   breath sounds clear to auscultation       Cardiovascular Exercise Tolerance: Good hypertension, Pt. on medications and Pt. on home beta blockers + CAD  + dysrhythmias Atrial Fibrillation + Valvular Problems/Murmurs  Rhythm:Irregular Rate:Abnormal  Coronary CT 21 MPRESSION: 1. Coronary calcium score of 248. This was 87th percentile for age and sex matched controls.   2. Normal coronary origin with right dominance.   3. Minimal, non-obstructive CAD (<25%).   4. Small PFO.   5. Moderate mitral annular calcification.    Neuro/Psych   Anxiety     negative neurological ROS  negative psych ROS   GI/Hepatic negative GI ROS, Neg liver ROS,,,  Endo/Other  negative endocrine ROS    Renal/GU negative Renal ROS  negative genitourinary   Musculoskeletal   Abdominal   Peds  Hematology negative hematology ROS (+)   Anesthesia Other Findings   Reproductive/Obstetrics negative OB ROS                             Anesthesia Physical Anesthesia Plan  ASA: 3  Anesthesia Plan: General   Post-op Pain Management: Minimal or no pain anticipated   Induction: Intravenous  PONV Risk Score and Plan: 3 and Propofol infusion  Airway Management Planned: Natural Airway and Simple Face Mask  Additional Equipment: None  Intra-op Plan:   Post-operative Plan:   Informed Consent: I have reviewed the patients History and Physical, chart, labs and discussed the procedure including the risks, benefits and alternatives for the proposed anesthesia with the patient or authorized representative  who has indicated his/her understanding and acceptance.     Dental advisory given  Plan Discussed with: Anesthesiologist and CRNA  Anesthesia Plan Comments:         Anesthesia Quick Evaluation

## 2022-04-22 ENCOUNTER — Ambulatory Visit (HOSPITAL_COMMUNITY): Payer: Medicare Other | Admitting: Anesthesiology

## 2022-04-22 ENCOUNTER — Other Ambulatory Visit: Payer: Self-pay

## 2022-04-22 ENCOUNTER — Encounter (HOSPITAL_COMMUNITY)
Admission: RE | Disposition: A | Discharge status: Home / Self Care | Payer: Self-pay | Source: Ambulatory Visit | Attending: Cardiology

## 2022-04-22 ENCOUNTER — Encounter (HOSPITAL_COMMUNITY): Payer: Self-pay | Admitting: Cardiology

## 2022-04-22 ENCOUNTER — Ambulatory Visit (HOSPITAL_COMMUNITY)
Admission: RE | Admit: 2022-04-22 | Discharge: 2022-04-22 | Disposition: A | Discharge status: Home / Self Care | Payer: Medicare Other | Source: Ambulatory Visit | Attending: Cardiology | Admitting: Cardiology

## 2022-04-22 ENCOUNTER — Other Ambulatory Visit: Payer: Self-pay | Admitting: Cardiology

## 2022-04-22 ENCOUNTER — Ambulatory Visit (HOSPITAL_BASED_OUTPATIENT_CLINIC_OR_DEPARTMENT_OTHER): Payer: Medicare Other | Admitting: Anesthesiology

## 2022-04-22 DIAGNOSIS — I4891 Unspecified atrial fibrillation: Secondary | ICD-10-CM | POA: Diagnosis not present

## 2022-04-22 DIAGNOSIS — N959 Unspecified menopausal and perimenopausal disorder: Secondary | ICD-10-CM | POA: Diagnosis not present

## 2022-04-22 DIAGNOSIS — I10 Essential (primary) hypertension: Secondary | ICD-10-CM

## 2022-04-22 DIAGNOSIS — I480 Paroxysmal atrial fibrillation: Secondary | ICD-10-CM

## 2022-04-22 DIAGNOSIS — I081 Rheumatic disorders of both mitral and tricuspid valves: Secondary | ICD-10-CM | POA: Insufficient documentation

## 2022-04-22 DIAGNOSIS — I4819 Other persistent atrial fibrillation: Secondary | ICD-10-CM

## 2022-04-22 DIAGNOSIS — I2510 Atherosclerotic heart disease of native coronary artery without angina pectoris: Secondary | ICD-10-CM | POA: Diagnosis not present

## 2022-04-22 DIAGNOSIS — E669 Obesity, unspecified: Secondary | ICD-10-CM | POA: Insufficient documentation

## 2022-04-22 DIAGNOSIS — Z79899 Other long term (current) drug therapy: Secondary | ICD-10-CM | POA: Diagnosis not present

## 2022-04-22 DIAGNOSIS — Z6836 Body mass index (BMI) 36.0-36.9, adult: Secondary | ICD-10-CM | POA: Insufficient documentation

## 2022-04-22 DIAGNOSIS — Z7901 Long term (current) use of anticoagulants: Secondary | ICD-10-CM | POA: Insufficient documentation

## 2022-04-22 DIAGNOSIS — G4733 Obstructive sleep apnea (adult) (pediatric): Secondary | ICD-10-CM | POA: Insufficient documentation

## 2022-04-22 DIAGNOSIS — I700 Atherosclerosis of aorta: Secondary | ICD-10-CM | POA: Insufficient documentation

## 2022-04-22 HISTORY — PX: TEE WITHOUT CARDIOVERSION: SHX5443

## 2022-04-22 HISTORY — PX: CARDIOVERSION: SHX1299

## 2022-04-22 HISTORY — PX: BUBBLE STUDY: SHX6837

## 2022-04-22 LAB — POCT I-STAT, CHEM 8
BUN: 25 mg/dL — ABNORMAL HIGH (ref 8–23)
Calcium, Ion: 1.21 mmol/L (ref 1.15–1.40)
Chloride: 107 mmol/L (ref 98–111)
Creatinine, Ser: 0.7 mg/dL (ref 0.44–1.00)
Glucose, Bld: 107 mg/dL — ABNORMAL HIGH (ref 70–99)
HCT: 42 % (ref 36.0–46.0)
Hemoglobin: 14.3 g/dL (ref 12.0–15.0)
Potassium: 4.1 mmol/L (ref 3.5–5.1)
Sodium: 142 mmol/L (ref 135–145)
TCO2: 24 mmol/L (ref 22–32)

## 2022-04-22 SURGERY — ECHOCARDIOGRAM, TRANSESOPHAGEAL
Anesthesia: General

## 2022-04-22 MED ORDER — APIXABAN 5 MG PO TABS: 5.0000 mg | ORAL_TABLET | Freq: Two times a day (BID) | ORAL | 0 refills | Status: DC

## 2022-04-22 MED ORDER — SODIUM CHLORIDE 0.9 % IV SOLN: INTRAVENOUS | Status: DC

## 2022-04-22 MED ORDER — PHENYLEPHRINE 80 MCG/ML (10ML) SYRINGE FOR IV PUSH (FOR BLOOD PRESSURE SUPPORT)
PREFILLED_SYRINGE | INTRAVENOUS | Status: DC | PRN
  Administered 2022-04-22: 80 ug via INTRAVENOUS

## 2022-04-22 MED ORDER — METOPROLOL TARTRATE 100 MG PO TABS: 100.0000 mg | ORAL_TABLET | Freq: Two times a day (BID) | ORAL | 11 refills | Status: DC

## 2022-04-22 MED ORDER — PROPOFOL 500 MG/50ML IV EMUL
INTRAVENOUS | Status: DC | PRN
  Administered 2022-04-22 (×2): 20 mg via INTRAVENOUS
  Administered 2022-04-22: 150 ug/kg/min via INTRAVENOUS

## 2022-04-22 MED ORDER — LIDOCAINE 2% (20 MG/ML) 5 ML SYRINGE
INTRAMUSCULAR | Status: DC | PRN
  Administered 2022-04-22: 40 mg via INTRAVENOUS

## 2022-04-22 MED ORDER — LACTATED RINGERS IV SOLN: INTRAVENOUS | Status: DC

## 2022-04-22 NOTE — Interval H&P Note (Signed)
History and Physical Interval Note:  04/22/2022 8:54 AM  Kimberly Lamb  has presented today for surgery, with the diagnosis of AFIB.  The various methods of treatment have been discussed with the patient and family. After consideration of risks, benefits and other options for treatment, the patient has consented to  Procedure(s): TRANSESOPHAGEAL ECHOCARDIOGRAM (TEE) (N/A) CARDIOVERSION (N/A) as a surgical intervention.  The patient's history has been reviewed, patient examined, no change in status, stable for surgery.  I have reviewed the patient's chart and labs.  Questions were answered to the patient's satisfaction.    After careful review of history and examination, the risks, benefits of transesophageal echocardiogram, and alternatives have been explained to the patient. Complications include but not limited to esophageal perforation (rare), gastrointestinal bleeding (rare), cardiac arrhythmia which can include cardiac arrest and death (rare), pharyngeal irritation / discomfort with swallowing / hematoma, methemoglobinemia, bronchospasm, transient hypoxia, nonsustained ventricular tachycardia, transient atrial fibrillation, minimal hemoptysis, vomiting, hypotension, respiratory compromise, reaction to medications, unavoidable damage to teeth and gums, aspiration pneumonia  were reviewed with the patient.  Patient voices understands, provides verbal feedback, questions answered, and patient wishes to proceed with the procedure.  Risks, benefits, and alternatives of direct current cardioversion reviewed with the and patient. Risk includes but not limited to: potential for post-cardioversion rhythms, life-threatening arrhythmias (ventricular tachycardia and fibrillation, profound bradycardia, cardiac arrest), myocardial damage, acute pulmonary edema, skin burns, transient hypotension. Benefits include restoration of sinus rhythm. Alternatives to treatment were discussed, questions were answered, patient  voices understanding and provides verbal feedback.  Patient is willing to proceed.   Rex Kras, [place], [facility]  Pager: [phone] Office: [phone] 8:55 AM 04/22/22

## 2022-04-22 NOTE — Anesthesia Postprocedure Evaluation (Signed)
Anesthesia Post Note  Patient: Kimberly Lamb  Procedure(s) Performed: TRANSESOPHAGEAL ECHOCARDIOGRAM (TEE) CARDIOVERSION BUBBLE STUDY     Patient location during evaluation: PACU Anesthesia Type: General Level of consciousness: awake and alert Pain management: pain level controlled Vital Signs Assessment: post-procedure vital signs reviewed and stable Respiratory status: spontaneous breathing, nonlabored ventilation, respiratory function stable and patient connected to nasal cannula oxygen Cardiovascular status: blood pressure returned to baseline and stable Postop Assessment: no apparent nausea or vomiting Anesthetic complications: no   No notable events documented.  Last Vitals:  Vitals:   04/22/22 0945 04/22/22 0947  BP:    Pulse: (!) 49 (!) 45  Resp: 16 19  Temp:    SpO2: 100% 99%    Last Pain:  Vitals:   04/22/22 0947  TempSrc:   PainSc: 0-No pain                 Effie Berkshire

## 2022-04-22 NOTE — Progress Notes (Signed)
  Echocardiogram 2D Echocardiogram has been performed.  Kimberly Lamb 04/22/2022, 9:48 AM

## 2022-04-22 NOTE — Transfer of Care (Signed)
Immediate Anesthesia Transfer of Care Note  Patient: Kimberly Lamb  Procedure(s) Performed: TRANSESOPHAGEAL ECHOCARDIOGRAM (TEE) CARDIOVERSION BUBBLE STUDY  Patient Location: Endoscopy Unit  Anesthesia Type:MAC  Level of Consciousness: drowsy and patient cooperative  Airway & Oxygen Therapy: Patient Spontanous Breathing and Patient connected to nasal cannula oxygen  Post-op Assessment: Report given to RN, Post -op Vital signs reviewed and stable, and Patient moving all extremities  Post vital signs: Reviewed and stable  Last Vitals:  Vitals Value Taken Time  BP 88/50 04/22/22 0936  Temp    Pulse 50 04/22/22 0939  Resp 22 04/22/22 0939  SpO2 98 % 04/22/22 0939  Vitals shown include unvalidated device data.  Last Pain:  Vitals:   04/22/22 0822  TempSrc: Temporal  PainSc: 0-No pain         Complications: No notable events documented.

## 2022-04-22 NOTE — Discharge Instructions (Signed)
Electrical Cardioversion  Electrical cardioversion is the delivery of a jolt of electricity to restore a normal rhythm to the heart. A rhythm that is too fast or is not regular keeps the heart from pumping well. In this procedure, sticky patches or metal paddles are placed on the chest to deliver electricity to the heart from a device.  If this information does not answer your questions, please call [facility] office at [phone] to clarify.   Follow these instructions at home: You may have some redness on the skin where the shocks were given.  You may apply over-the-counter hydrocortisone cream or aloe vera to alleviate skin irritation. YOU SHOULD NOT DRIVE, use power tools, machinery or perform tasks that involve climbing or major physical exertion for 24 hours (because of the sedation medicines used during the test).  Take over-the-counter and prescription medicines only as told by your health care provider. Ask your health care provider how to check your pulse. Check it often. Rest for 48 hours after the procedure or as told by your health care provider. Avoid or limit your caffeine use as told by your health care provider. Keep all follow-up visits as told by your health care provider. This is important.  FOLLOW UP:  Please also call with any specific questions about appointments or follow up tests. TEE  YOU HAD AN CARDIAC PROCEDURE TODAY: Refer to the procedure report and other information in the discharge instructions given to you for any specific questions about what was found during the examination. If this information does not answer your questions, please call [facility] office at [phone] to clarify.   DIET: Your first meal following the procedure should be a light meal and then it is ok to progress to your normal diet. A half-sandwich or bowl of soup is an example of a good first meal. Heavy or fried foods are harder to digest and may make you feel  nauseous or bloated. Drink plenty of fluids but you should avoid alcoholic beverages for 24 hours. If you had a esophageal dilation, please see attached instructions for diet.   ACTIVITY: Your care partner should take you home directly after the procedure. You should plan to take it easy, moving slowly for the rest of the day. You can resume normal activity the day after the procedure however YOU SHOULD NOT DRIVE, use power tools, machinery or perform tasks that involve climbing or major physical exertion for 24 hours (because of the sedation medicines used during the test).   SYMPTOMS TO REPORT IMMEDIATELY: A cardiologist can be reached at any hour. Please call [phone] for any of the following symptoms:  Vomiting of blood or coffee ground material  New, significant abdominal pain  New, significant chest pain or pain under the shoulder blades  Painful or persistently difficult swallowing  New shortness of breath  Black, tarry-looking or red, bloody stools  FOLLOW UP:  Please also call with any specific questions about appointments or follow up tests. TEE  YOU HAD AN CARDIAC PROCEDURE TODAY: Refer to the procedure report and other information in the discharge instructions given to you for any specific questions about what was found during the examination. If this information does not answer your questions, please call [facility] office at [phone] to clarify.   DIET: Your first meal following the procedure should be a light meal and then it is ok to progress to your normal diet. A half-sandwich or bowl of soup is an example   of a good first meal. Heavy or fried foods are harder to digest and may make you feel nauseous or bloated. Drink plenty of fluids but you should avoid alcoholic beverages for 24 hours. If you had a esophageal dilation, please see attached instructions for diet.   ACTIVITY: Your care partner should take you home directly after the procedure. You should plan to take  it easy, moving slowly for the rest of the day. You can resume normal activity the day after the procedure however YOU SHOULD NOT DRIVE, use power tools, machinery or perform tasks that involve climbing or major physical exertion for 24 hours (because of the sedation medicines used during the test).   SYMPTOMS TO REPORT IMMEDIATELY: A cardiologist can be reached at any hour. Please call [phone] for any of the following symptoms:  Vomiting of blood or coffee ground material  New, significant abdominal pain  New, significant chest pain or pain under the shoulder blades  Painful or persistently difficult swallowing  New shortness of breath  Black, tarry-looking or red, bloody stools  FOLLOW UP:  Please also call with any specific questions about appointments or follow up tests.  

## 2022-04-22 NOTE — CV Procedure (Signed)
Transesophageal echocardiogram (TEE) : Preliminary report 04/22/22  Sedation: See anesthesia report   TEE was performed without complications   LV: Mildly reduced EF 45-50%, visually.  RV: Normal structure and function.   LA: mild to moderate dilated.  Spontaneous echo contrast was present.  No thrombus present. Left atrial appendage: Spontaneous echo contrast was  present.  No thrombus present. Inter atrial septum redundant w/ presence of patent foramen ovale w/ Color Doppler and Bubble study.  RA: Grossly normal.    MV: mild regurgitation,no stenosis. TV: moderate regurgitation, no stenosis.. AV: no regurgitation, no stenosis. PV: no regurgitation, no stenosis.    Thoracic and ascending aorta: Mild plaque.   Final report forthcoming.  [name]  Pager: [phone] Office: [phone]  Direct current cardioversion:  Procedure: Electrical Cardioversion Indications:  Atrial Fibrillation  Procedure Details:  Consent: Risks of procedure as well as the alternatives and risks of each were explained to the (patient/caregiver).  Consent for procedure obtained.  Time Out: Verified patient identification, verified procedure, site/side was marked, verified correct patient position, special equipment/implants available, medications/allergies/relevent history reviewed, required imaging and test results available. PERFORMED.  Patient placed on cardiac monitor, pulse oximetry, supplemental oxygen as necessary.  Sedation given:  see anesthesia records  Pacer pads placed anterior and posterior chest.  Cardioverted 1 time(s).  Cardioversion with synchronized biphasic 200J shock.  Evaluation: Findings: Post procedure EKG shows:  sinus bradycardia Complications: None Patient did tolerate procedure well.  [name] [facility]  Pager: [phone] Office: [phone] 04/22/2022, 9:31 AM

## 2022-04-26 ENCOUNTER — Encounter (HOSPITAL_COMMUNITY): Payer: Self-pay | Admitting: Cardiology

## 2022-05-01 ENCOUNTER — Ambulatory Visit: Payer: Medicare Other | Admitting: Cardiology

## 2022-05-03 LAB — ECHO TEE: Single Plane A4C EF: 50.6 %

## 2022-05-07 ENCOUNTER — Encounter: Payer: Self-pay | Admitting: Cardiology

## 2022-05-07 ENCOUNTER — Ambulatory Visit: Payer: Medicare Other | Admitting: Cardiology

## 2022-05-07 VITALS — BP 168/89 | HR 57 | Resp 17 | Ht 66.0 in | Wt 228.4 lb

## 2022-05-07 DIAGNOSIS — Q2112 Patent foramen ovale: Secondary | ICD-10-CM

## 2022-05-07 DIAGNOSIS — Z79899 Other long term (current) drug therapy: Secondary | ICD-10-CM

## 2022-05-07 DIAGNOSIS — I480 Paroxysmal atrial fibrillation: Secondary | ICD-10-CM

## 2022-05-07 DIAGNOSIS — E6609 Other obesity due to excess calories: Secondary | ICD-10-CM

## 2022-05-07 DIAGNOSIS — I10 Essential (primary) hypertension: Secondary | ICD-10-CM

## 2022-05-07 DIAGNOSIS — I2510 Atherosclerotic heart disease of native coronary artery without angina pectoris: Secondary | ICD-10-CM

## 2022-05-07 DIAGNOSIS — E782 Mixed hyperlipidemia: Secondary | ICD-10-CM

## 2022-05-07 DIAGNOSIS — Z7901 Long term (current) use of anticoagulants: Secondary | ICD-10-CM

## 2022-05-07 MED ORDER — METOPROLOL TARTRATE 25 MG PO TABS: 25.0000 mg | ORAL_TABLET | Freq: Two times a day (BID) | ORAL | 0 refills | Status: DC

## 2022-05-07 MED ORDER — MULTAQ 400 MG PO TABS: 400.0000 mg | ORAL_TABLET | Freq: Two times a day (BID) | ORAL | 0 refills | Status: DC

## 2022-05-07 NOTE — Progress Notes (Signed)
Kimberly Lamb Date of Birth: Jul 05, 1950 MRN: [id number] Primary Care Provider:[place], Council Mechanic, FNP Primary Cardiologist: Rex Kras, DO (established care 06/30/2019)  Date: 05/07/22 Last Office Visit: 03/25/2022  Chief Complaint  Patient presents with   Follow-up    1 year Atrial fibrillation status post cardioversion   HPI  Kimberly Lamb is a 72 y.o. female whose past medical history and cardiac risk factors include: Paroxysmal atrial fibrillation, moderate coronary artery calcification, nonobstructive CAD, obstructive sleep apnea on CPAP, benign essential hypertension, obesity, due to excess calories, postmenopausal female, advanced age.  Patient is being followed by the practice for management of paroxysmal atrial fibrillation and coronary artery calcification/minimal nonobstructive CAD.   Due to persistent symptomatic atrial fibrillation despite up titration of AV nodal blocking agents and antiarrhythmic medications she is scheduled for TEE guided cardioversion on 04/22/2022.  She converted to sinus rhythm after 200 J x 1.  Last cardioversion she was noted to have bradycardia and therefore her metoprolol was reduced to 50 mg p.o. twice daily.  She presents today for follow-up accompanied by her daughter.  Based on her smart watch she has been sinus rhythm majority of the time with A-fib burden less than 1%.  She feels tired and fatigue.  She is predominantly bradycardic at home at rest.  She also states that the Eliquis is becoming cost prohibitive and is planning to have medications imported from [place].  Office blood pressures are not well-controlled but she has been out of her hydrochlorothiazide for the last several days.  Patient stated since the TEE guided cardioversion procedure she is been having difficulty lifting the left upper arm from the shoulder to the elbow above shoulder length for period of time.  No focal neurological deficits, facial asymmetry, or change in muscle  strength.  The discomfort improved after taking meloxicam and at times can be radicular up to the left thumb.  ALLERGIES: No Known Allergies  MEDICATION LIST PRIOR TO VISIT: Current Outpatient Medications on File Prior to Visit  Medication Sig Dispense Refill   amLODipine (NORVASC) 10 MG tablet Take 1 tablet (10 mg total) by mouth daily. 90 tablet 1   apixaban (ELIQUIS) 5 MG TABS tablet Take 1 tablet (5 mg total) by mouth 2 (two) times daily. 180 tablet 0   aspirin 81 MG EC tablet Take 81 mg by mouth daily. Swallow whole.     atorvastatin (LIPITOR) 20 MG tablet Take 1 tablet (20 mg total) by mouth at bedtime. 90 tablet 1   citalopram (CELEXA) 40 MG tablet Take 40 mg by mouth daily.     cyclobenzaprine (FLEXERIL) 5 MG tablet Take 5 mg by mouth 3 (three) times daily as needed for muscle spasms.     dicyclomine (BENTYL) 20 MG tablet Take 20 mg by mouth 2 (two) times daily.     furosemide (LASIX) 20 MG tablet Take 0.5 tablets (10 mg total) by mouth in the morning. 45 tablet 0   gabapentin (NEURONTIN) 100 MG capsule Take 100 mg by mouth 2 (two) times daily.     hydrochlorothiazide (HYDRODIURIL) 25 MG tablet Take 1 tablet (25 mg total) by mouth in the morning. 90 tablet 1   lisinopril (ZESTRIL) 40 MG tablet Take 1 tablet (40 mg total) by mouth daily. 90 tablet 1   meloxicam (MOBIC) 15 MG tablet Take 15 mg by mouth daily.     nitroGLYCERIN (NITROSTAT) 0.4 MG SL tablet Place 1 tablet (0.4 mg total) under the tongue every 5 (five) minutes as needed  for chest pain. 90 tablet 3   pantoprazole (PROTONIX) 40 MG tablet Take 40 mg by mouth 2 (two) times daily.     promethazine (PHENERGAN) 25 MG suppository Place 25 mg rectally every 6 (six) hours as needed for nausea or vomiting.     traMADol (ULTRAM) 50 MG tablet Take 50 mg by mouth every 6 (six) hours as needed for severe pain.     No current facility-administered medications on file prior to visit.    PAST MEDICAL HISTORY: Past Medical History:   Diagnosis Date   Anxiety    Coronary artery calcification    Heart murmur    Hyperlipidemia    Hypertension    Nonobstructive atherosclerosis of coronary artery     PAST SURGICAL HISTORY: Past Surgical History:  Procedure Laterality Date   ACHILLES TENDON SURGERY  2010   BUBBLE STUDY  04/22/2022   Procedure: BUBBLE STUDY;  Surgeon: Rex Kras, DO;  Location: [facility];  Service: Cardiovascular;;   CARDIOVERSION N/A 04/22/2022   Procedure: CARDIOVERSION;  Surgeon: Rex Kras, DO;  Location: [facility];  Service: Cardiovascular;  Laterality: N/A;   knee replacement Right 2009   x3 2009, 2012, 2014   ROTATOR CUFF REPAIR  2011   Right   SPINAL FUSION     l3   TEE WITHOUT CARDIOVERSION N/A 04/22/2022   Procedure: TRANSESOPHAGEAL ECHOCARDIOGRAM (TEE);  Surgeon: Rex Kras, DO;  Location: [facility];  Service: Cardiovascular;  Laterality: N/A;   VAGINAL HYSTERECTOMY  1986    FAMILY HISTORY: The patient family history includes Heart attack in her father; Heart failure in her father.   SOCIAL HISTORY:  The patient  reports that she has never smoked. She has never used smokeless tobacco. She reports that she does not drink alcohol and does not use drugs.  Review of Systems  Constitutional: Positive for malaise/fatigue.  Cardiovascular:  Negative for chest pain, claudication, dyspnea on exertion, irregular heartbeat, leg swelling, near-syncope, orthopnea, palpitations, paroxysmal nocturnal dyspnea and syncope.  Respiratory:  Negative for shortness of breath.   Hematologic/Lymphatic: Negative for bleeding problem.  Musculoskeletal:  Negative for muscle cramps and myalgias.  Neurological:  Negative for dizziness and light-headedness.   PHYSICAL EXAM:    05/07/2022    3:55 PM 04/22/2022    9:47 AM 04/22/2022    9:45 AM  Vitals with BMI  Height _0     Weight 228 lbs 6 oz    BMI 28.31    Systolic 517    Diastolic 89    Pulse 57 45 49   Physical Exam  Constitutional: No  distress.  Age appropriate, hemodynamically stable.   Neck: No JVD present.  Cardiovascular: Regular rhythm, S1 normal, S2 normal, intact distal pulses and normal pulses. Bradycardia present. Exam reveals no gallop, no S3 and no S4.  No murmur heard. Pulses:      Popliteal pulses are 2+ on the right side and 2+ on the left side.       Dorsalis pedis pulses are 2+ on the right side and 2+ on the left side.  Pulmonary/Chest: Effort normal and breath sounds normal. No stridor. She has no wheezes. She has no rales.  Abdominal: Soft. Bowel sounds are normal. She exhibits no distension. There is no abdominal tenderness.  Musculoskeletal:        General: No edema.     Cervical back: Neck supple.  Neurological: She is alert and oriented to person, place, and time. She has intact cranial nerves (2-12).  Skin: Skin is warm and moist.   CARDIAC DATABASE: EKG: 05/07/2022: Sinus bradycardia, 56 bpm without underlying injury pattern.  Echocardiogram: 07/06/2019: LVEF 55-60%, moderate left ventricular hypertrophy, indeterminate diastolic filling pattern, mild to moderately dilated left atrium, mild MAC, mild MR, mild TR.  TEE 04/22/2022:  LVEF 50 to 55%, normal right ventricular function, Moderately dilated left atrium, mild MR, moderate TR, mild aortic root dilatation 39 mm, aortic atherosclerosis, positive bubble study due to PFO.  Stress Testing:  Carlton Adam (Walking with mod Bruce)Tetrofosmin Stress Test  08/09/2019: Mild degree Moderate extent perfusion defect consistent with mild (reversible) ischemia located in the mid to distal lateral wall (Left Circumflex Artery region) of left ventricle. Overall LV systolic function is normal without regional wall motion abnormalities. Stress LV EF: 75%. Low risk.  Coronary CTA: 08/24/2019: 1. Coronary calcium score of 248. This was 87th percentile for age and sex matched controls. 2. Normal coronary origin with right dominance.  3. Minimal, non-obstructive  CAD (<25%).  4. Small PFO.  5. Moderate mitral annular calcification.  RECOMMENDATIONS: Minimal non-obstructive CAD (0-24%). Consider preventive therapy and risk factor modification.  Heart Catheterization: None  Transesophageal guided direct-current cardioversion: 04/22/2022: Converted to sinus bradycardia after 200 J x 1.  LABORATORY DATA: External Labs: Collected: 05/26/2021 provided by Dr. Thornton Papas Hemoglobin 14.6 g/dL, hematocrit 45.5% BUN 24, creatinine 0.83. Sodium 140, potassium 4.4, chloride 108, bicarb 30 AST 11, ALT 20, alkaline phosphatase 72. Total cholesterol 145, triglycerides 135, HDL 52, LDL 67 TSH 1.02     Latest Ref Rng & Units 04/22/2022    8:42 AM  CBC  Hemoglobin 12.0 - 15.0 g/dL 14.3   Hematocrit 36.0 - 46.0 % 42.0       Latest Ref Rng & Units 04/22/2022    8:42 AM 08/17/2019    9:46 AM 07/19/2019    1:38 PM  BMP  Glucose 70 - 99 mg/dL 107  100  94   BUN 8 - 23 mg/dL _0 Creatinine 0.44 - 1.00 mg/dL 0.70  0.74  0.84   BUN/Creat Ratio 12 - _1 Sodium 135 - 145 mmol/L 142  142  142   Potassium 3.5 - 5.1 mmol/L 4.1  4.3  4.1   Chloride 98 - 111 mmol/L 107  102  103   CO2 20 - 29 mmol/L  23  22   Calcium 8.7 - 10.3 mg/dL  9.4  9.6     FINAL MEDICATION LIST END OF ENCOUNTER: Meds ordered this encounter  Medications   metoprolol tartrate (LOPRESSOR) 25 MG tablet    Sig: Take 1 tablet (25 mg total) by mouth 2 (two) times daily. Hold if systolic blood pressure (top number) less than 100 mmHg or pulse less than 60 bpm.    Dispense:  180 tablet    Refill:  0   dronedarone (MULTAQ) 400 MG tablet    Sig: Take 1 tablet (400 mg total) by mouth 2 (two) times daily with a meal.    Dispense:  60 tablet    Refill:  0    Medications Discontinued During This Encounter  Medication Reason   metoprolol tartrate (LOPRESSOR) 100 MG tablet Dose change   flecainide (TAMBOCOR) 50 MG tablet Change in therapy      Current Outpatient Medications:     amLODipine (NORVASC) 10 MG tablet, Take 1 tablet (10 mg total) by mouth daily., Disp: 90 tablet, Rfl: 1   apixaban (ELIQUIS)  5 MG TABS tablet, Take 1 tablet (5 mg total) by mouth 2 (two) times daily., Disp: 180 tablet, Rfl: 0   aspirin 81 MG EC tablet, Take 81 mg by mouth daily. Swallow whole., Disp: , Rfl:    atorvastatin (LIPITOR) 20 MG tablet, Take 1 tablet (20 mg total) by mouth at bedtime., Disp: 90 tablet, Rfl: 1   citalopram (CELEXA) 40 MG tablet, Take 40 mg by mouth daily., Disp: , Rfl:    cyclobenzaprine (FLEXERIL) 5 MG tablet, Take 5 mg by mouth 3 (three) times daily as needed for muscle spasms., Disp: , Rfl:    dicyclomine (BENTYL) 20 MG tablet, Take 20 mg by mouth 2 (two) times daily., Disp: , Rfl:    furosemide (LASIX) 20 MG tablet, Take 0.5 tablets (10 mg total) by mouth in the morning., Disp: 45 tablet, Rfl: 0   gabapentin (NEURONTIN) 100 MG capsule, Take 100 mg by mouth 2 (two) times daily., Disp: , Rfl:    hydrochlorothiazide (HYDRODIURIL) 25 MG tablet, Take 1 tablet (25 mg total) by mouth in the morning., Disp: 90 tablet, Rfl: 1   lisinopril (ZESTRIL) 40 MG tablet, Take 1 tablet (40 mg total) by mouth daily., Disp: 90 tablet, Rfl: 1   meloxicam (MOBIC) 15 MG tablet, Take 15 mg by mouth daily., Disp: , Rfl:    nitroGLYCERIN (NITROSTAT) 0.4 MG SL tablet, Place 1 tablet (0.4 mg total) under the tongue every 5 (five) minutes as needed for chest pain., Disp: 90 tablet, Rfl: 3   pantoprazole (PROTONIX) 40 MG tablet, Take 40 mg by mouth 2 (two) times daily., Disp: , Rfl:    promethazine (PHENERGAN) 25 MG suppository, Place 25 mg rectally every 6 (six) hours as needed for nausea or vomiting., Disp: , Rfl:    traMADol (ULTRAM) 50 MG tablet, Take 50 mg by mouth every 6 (six) hours as needed for severe pain., Disp: , Rfl:    dronedarone (MULTAQ) 400 MG tablet, Take 1 tablet (400 mg total) by mouth 2 (two) times daily with a meal., Disp: 60 tablet, Rfl: 0   metoprolol tartrate (LOPRESSOR)  25 MG tablet, Take 1 tablet (25 mg total) by mouth 2 (two) times daily. Hold if systolic blood pressure (top number) less than 100 mmHg or pulse less than 60 bpm., Disp: 180 tablet, Rfl: 0  IMPRESSION:    ICD-10-CM   1. Paroxysmal atrial fibrillation (HCC)  I48.0 EKG 12-Lead    metoprolol tartrate (LOPRESSOR) 25 MG tablet    dronedarone (MULTAQ) 400 MG tablet    Pro b natriuretic peptide (BNP)    Basic metabolic panel    Magnesium    2. Long term (current) use of anticoagulants  Z79.01     3. Long term current use of antiarrhythmic drug  Z79.899     4. Nonobstructive atherosclerosis of coronary artery  I25.10     5. Coronary artery calcification seen on computed tomography  I25.10     6. Benign hypertension  I10     7. Mixed hyperlipidemia  E78.2     8. Class 2 obesity due to excess calories without serious comorbidity with body mass index (BMI) of 36.0 to 36.9 in adult  E66.09    Z68.36        RECOMMENDATIONS: Kimberly Lamb is a 72 y.o. female whose past medical history and cardiac risk factors include: Paroxysmal atrial fibrillation, moderate coronary artery calcification, nonobstructive CAD, obstructive sleep apnea on CPAP, benign essential hypertension, obesity, due to excess calories, postmenopausal  female, advanced age.  Paroxysmal atrial fibrillation (HCC) Rate control: Metoprolol. Rhythm control: Multaq. Thromboembolic prophylaxis: Eliquis. CHA2DS2-VASc SCORE is 4 which correlates to 4.8 % risk of stroke per year (age, HTN, aortic atherosclerosis). Underwent TEE guided cardioversion 04/22/2022 200 J x 1.  Long term (current) use of anticoagulants Indication: Paroxysmal atrial fibrillation Provided patient samples for Eliquis. Also provided her patient assistance form to see pulm for further assistance. If Xarelto is on her formulary we will transition her to Xarelto if its cost effective.  Patient will get back to [place] regarding long-term anticoagulation  coverage.  Long term current use of antiarrhythmic drug Indication: Paroxysmal atrial fibrillation. Given her symptoms of feeling tired, fatigued.   Recommend transitioning from flecainide to Multaq. Patient is asked to stop flecainide for 3 days prior to starting Multaq.  And if she does start Multaq patient is asked to stop Celexa due to drug to drug interaction if okay per prescribing provider.  Nonobstructive atherosclerosis of coronary artery Coronary artery calcification Continue aspirin and statin therapy. Denies anginal discomfort. Reemphasized importance of secondary prevention.  Patent foramen ovale: Noted on coronary CTA as well as a TEE. Currently asymptomatic Monitor for now.   Benign hypertension Office blood pressures are not well-controlled. Has not been on hydrochlorothiazide for the last several days. I have asked her to keep a log of her blood pressures and to review with PCP. Patient also states that she has been having greater episodes of apnea at night.  I have asked her to follow-up with her sleep provider to see if the machine setting needs to be changed and/or reevaluation is warranted.  Mixed hyperlipidemia Currently on atorvastatin.   She denies myalgia or other side effects. Currently managed by primary care provider.  Class 2 obesity due to excess calories without serious comorbidity with body mass index (BMI) of 36.0 to 36.9 in adult Body mass index is 36.86 kg/m. I reviewed with her importance of diet, regular physical activity/exercise, weight loss.   Patient is educated on the importance of increasing physical activity gradually as tolerated with a goal of moderate intensity exercise for 30 minutes a day 5 days a week.  Total time spent: Greater than 40 minutes discussing management of paroxysmal atrial fibrillation, oral anticoagulation options, monitoring high risk medication such as antiarrhythmics, discussing drug drug interactions,  prescribing and refilling prescriptions, follow-up labs.  Plan of care discussed with both patient and her daughter at today's visit.   Orders Placed This Encounter  Procedures   Pro b natriuretic peptide (BNP)   Basic metabolic panel   Magnesium   EKG 12-Lead   --Continue cardiac medications as reconciled in final medication list. --Return in about 3 months (around 08/10/2022) for Follow up, A. fib. Or sooner if needed. --Continue follow-up with your primary care physician regarding the management of your other chronic comorbid conditions.  Patient's questions and concerns were addressed to her satisfaction. She voices understanding of the instructions provided during this encounter.   This note was created using a voice recognition software as a result there may be grammatical errors inadvertently enclosed that do not reflect the nature of this encounter. Every attempt is made to correct such errors.  Rex Kras, [place], [facility]  Pager: [phone] Office: [phone]

## 2022-05-12 ENCOUNTER — Telehealth: Payer: Self-pay

## 2022-05-12 NOTE — Telephone Encounter (Signed)
Not taking Lexapro anymore taking paxil 30 mg starting today.  Staying on Flecainide 50mg .  MULTAQ is too expensive.

## 2022-05-14 MED ORDER — FLECAINIDE ACETATE 50 MG PO TABS: 50.0000 mg | ORAL_TABLET | Freq: Two times a day (BID) | ORAL | 0 refills | Status: DC

## 2022-05-14 NOTE — Telephone Encounter (Signed)
Confirmed rx was received by pharmacy.

## 2022-05-14 NOTE — Addendum Note (Signed)
Addended by: Leanord Asal T on: 05/14/2022 03:39 PM   Modules accepted: Orders

## 2022-05-14 NOTE — Telephone Encounter (Signed)
I don't see it went through

## 2022-05-14 NOTE — Telephone Encounter (Signed)
Signed for flecainide please make sure it went through.   ST

## 2022-05-27 ENCOUNTER — Telehealth: Payer: Self-pay

## 2022-05-27 NOTE — Telephone Encounter (Signed)
Patient daughter is faxing Eliquis patient assistance paperwork to [place] and will drop the original hard copy off next week.

## 2022-05-27 NOTE — Telephone Encounter (Signed)
Received faxed paperwork. Patient assistance Eliquis  paperwork on Tolia's desk.

## 2022-05-29 ENCOUNTER — Telehealth: Payer: Self-pay

## 2022-05-29 DIAGNOSIS — I480 Paroxysmal atrial fibrillation: Secondary | ICD-10-CM

## 2022-05-29 MED ORDER — APIXABAN 5 MG PO TABS: 5.0000 mg | ORAL_TABLET | Freq: Two times a day (BID) | ORAL | 3 refills | Status: DC

## 2022-06-01 NOTE — Telephone Encounter (Signed)
Patient assistance documented

## 2022-06-25 ENCOUNTER — Encounter (HOSPITAL_COMMUNITY): Payer: Self-pay

## 2022-06-25 ENCOUNTER — Emergency Department (HOSPITAL_COMMUNITY)
Admission: EM | Admit: 2022-06-25 | Discharge: 2022-06-25 | Disposition: A | Discharge status: Home / Self Care | Payer: Medicare Other | Attending: Emergency Medicine | Admitting: Emergency Medicine

## 2022-06-25 ENCOUNTER — Emergency Department (HOSPITAL_COMMUNITY): Payer: Medicare Other

## 2022-06-25 ENCOUNTER — Other Ambulatory Visit: Payer: Self-pay

## 2022-06-25 ENCOUNTER — Telehealth: Payer: Self-pay

## 2022-06-25 DIAGNOSIS — I471 Supraventricular tachycardia, unspecified: Secondary | ICD-10-CM | POA: Diagnosis not present

## 2022-06-25 DIAGNOSIS — Z79899 Other long term (current) drug therapy: Secondary | ICD-10-CM | POA: Diagnosis not present

## 2022-06-25 DIAGNOSIS — I10 Essential (primary) hypertension: Secondary | ICD-10-CM | POA: Diagnosis not present

## 2022-06-25 DIAGNOSIS — Z7982 Long term (current) use of aspirin: Secondary | ICD-10-CM | POA: Insufficient documentation

## 2022-06-25 DIAGNOSIS — Z7901 Long term (current) use of anticoagulants: Secondary | ICD-10-CM | POA: Diagnosis not present

## 2022-06-25 DIAGNOSIS — R000 Tachycardia, unspecified: Secondary | ICD-10-CM | POA: Diagnosis present

## 2022-06-25 DIAGNOSIS — I480 Paroxysmal atrial fibrillation: Secondary | ICD-10-CM

## 2022-06-25 DIAGNOSIS — I2510 Atherosclerotic heart disease of native coronary artery without angina pectoris: Secondary | ICD-10-CM

## 2022-06-25 DIAGNOSIS — E782 Mixed hyperlipidemia: Secondary | ICD-10-CM

## 2022-06-25 LAB — CBC
HCT: 46.5 % — ABNORMAL HIGH (ref 36.0–46.0)
Hemoglobin: 15.3 g/dL — ABNORMAL HIGH (ref 12.0–15.0)
MCH: 28.8 pg (ref 26.0–34.0)
MCHC: 32.9 g/dL (ref 30.0–36.0)
MCV: 87.4 fL (ref 80.0–100.0)
Platelets: 353 10*3/uL (ref 150–400)
RBC: 5.32 MIL/uL — ABNORMAL HIGH (ref 3.87–5.11)
RDW: 16 % — ABNORMAL HIGH (ref 11.5–15.5)
WBC: 11.8 10*3/uL — ABNORMAL HIGH (ref 4.0–10.5)
nRBC: 0 % (ref 0.0–0.2)

## 2022-06-25 LAB — MAGNESIUM: Magnesium: 2 mg/dL (ref 1.7–2.4)

## 2022-06-25 LAB — BASIC METABOLIC PANEL
Anion gap: 19 — ABNORMAL HIGH (ref 5–15)
BUN: 16 mg/dL (ref 8–23)
CO2: 17 mmol/L — ABNORMAL LOW (ref 22–32)
Calcium: 9.4 mg/dL (ref 8.9–10.3)
Chloride: 104 mmol/L (ref 98–111)
Creatinine, Ser: 1.06 mg/dL — ABNORMAL HIGH (ref 0.44–1.00)
GFR, Estimated: 55 mL/min — ABNORMAL LOW (ref >60)
Glucose, Bld: 125 mg/dL — ABNORMAL HIGH (ref 70–99)
Potassium: 3.8 mmol/L (ref 3.5–5.1)
Sodium: 140 mmol/L (ref 135–145)

## 2022-06-25 MED ORDER — ADENOSINE 6 MG/2ML IV SOLN
12.0000 mg | Freq: Once | INTRAVENOUS | Status: AC
  Administered 2022-06-25: 12 mg via INTRAVENOUS

## 2022-06-25 MED ORDER — SODIUM CHLORIDE 0.9 % IV BOLUS
500.0000 mL | Freq: Once | INTRAVENOUS | Status: AC
  Administered 2022-06-25: 500 mL via INTRAVENOUS

## 2022-06-25 MED ORDER — ADENOSINE 6 MG/2ML IV SOLN
6.0000 mg | Freq: Once | INTRAVENOUS | Status: AC
  Administered 2022-06-25: 6 mg via INTRAVENOUS

## 2022-06-25 MED ORDER — FLECAINIDE ACETATE 50 MG PO TABS
50.0000 mg | ORAL_TABLET | Freq: Once | ORAL | Status: AC
  Administered 2022-06-25: 50 mg via ORAL
  Filled 2022-06-25: qty 1

## 2022-06-25 MED ORDER — FLECAINIDE ACETATE 50 MG PO TABS: 100.0000 mg | ORAL_TABLET | Freq: Two times a day (BID) | ORAL | 0 refills | Status: DC

## 2022-06-25 NOTE — Consult Note (Signed)
CARDIOLOGY CONSULT NOTE  Patient ID: Kimberly Lamb MRN: [id number] DOB/AGE: [birth year] 72 y.o.  Admit date: 06/25/2022 Attending physician: Dr. Alvino Chapel Primary Physician:  Frances Maywood, [place] Provider: Rex Kras, DO, [facility] Inpatient Cardiologist: Rex Kras, DO, [facility]  Reason of consultation: Narrow complex tachycardia Referring physician: Dr. Alvino Chapel  Chief complaint: High heart rate  HPI:  Kimberly Lamb is a 72 y.o. Caucasian female who presents with a chief complaint of "high heart rate." Her past medical history and cardiovascular risk factors include:  Paroxysmal atrial fibrillation, moderate coronary artery calcification, nonobstructive CAD, obstructive sleep apnea on CPAP, benign essential hypertension, obesity, due to excess calories, postmenopausal female, advanced age.  Patient was in usual state of health this morning while she was at work her smart watch detected heart rate ranging from 180-200 bpm.  She called the office for further guidance.  Clinically she just felt tired, fatigued, not at baseline.  She does not appreciate when she goes in and out of the atrial fibrillation.   Given her history of atrial fibrillation I was concerned about A-fib with RVR and hemodynamic compromise and therefore she was requested to come to the ED for further evaluation and management.  ED provider called for consult for narrow complex tachycardia with concerns for possible SVT versus atrial fibrillation with rapid ventricular rate.  Review of EKG notes narrow complex tachycardia concerning for possible AVNRT.  Shared decision was to try adenosine to see if she converts to normal sinus rhythm or better understand the underlying rhythm.  ALLERGIES: No Known Allergies  PAST MEDICAL HISTORY: Past Medical History:  Diagnosis Date   Anxiety    Coronary artery calcification    Heart murmur    Hyperlipidemia    Hypertension    Nonobstructive atherosclerosis of  coronary artery     PAST SURGICAL HISTORY: Past Surgical History:  Procedure Laterality Date   ACHILLES TENDON SURGERY  2010   BUBBLE STUDY  04/22/2022   Procedure: BUBBLE STUDY;  Surgeon: Rex Kras, DO;  Location: [place] [facility];  Service: Cardiovascular;;   CARDIOVERSION N/A 04/22/2022   Procedure: CARDIOVERSION;  Surgeon: Rex Kras, DO;  Location: [place] [facility];  Service: Cardiovascular;  Laterality: N/A;   knee replacement Right 2009   x3 2009, 2012, 2014   ROTATOR CUFF REPAIR  2011   Right   SPINAL FUSION     l3   TEE WITHOUT CARDIOVERSION N/A 04/22/2022   Procedure: TRANSESOPHAGEAL ECHOCARDIOGRAM (TEE);  Surgeon: Rex Kras, DO;  Location: [facility];  Service: Cardiovascular;  Laterality: N/A;   VAGINAL HYSTERECTOMY  1986    FAMILY HISTORY: The patient's family history includes Heart attack in her father; Heart failure in her father.   SOCIAL HISTORY:  The patient  reports that she has never smoked. She has never used smokeless tobacco. She reports that she does not drink alcohol and does not use drugs.  MEDICATIONS: Current Outpatient Medications  Medication Instructions   amLODipine (NORVASC) 10 mg, Oral, Daily   apixaban (ELIQUIS) 5 mg, Oral, 2 times daily   aspirin EC 81 mg, Oral, Daily, Swallow whole.    atorvastatin (LIPITOR) 20 mg, Oral, Daily at bedtime   citalopram (CELEXA) 40 mg, Oral, Daily   cyclobenzaprine (FLEXERIL) 5 mg, Oral, 3 times daily PRN   dicyclomine (BENTYL) 20 mg, Oral, 2 times daily   flecainide (TAMBOCOR) 100 mg, Oral, 2 times daily   furosemide (LASIX) 10 mg, Oral, Every morning   gabapentin (NEURONTIN) 100 mg, Oral, 2 times daily  hydrochlorothiazide (HYDRODIURIL) 25 mg, Oral, Every morning   lisinopril (ZESTRIL) 40 mg, Oral, Daily   meloxicam (MOBIC) 15 mg, Oral, Daily   metoprolol tartrate (LOPRESSOR) 25 mg, Oral, 2 times daily, Hold if systolic blood pressure (top number) less than 100 mmHg or pulse less than 60 bpm.    nitroGLYCERIN (NITROSTAT) 0.4 mg, Sublingual, Every 5 min PRN   pantoprazole (PROTONIX) 40 mg, Oral, 2 times daily   promethazine (PHENERGAN) 25 mg, Rectal, Every 6 hours PRN   traMADol (ULTRAM) 50 mg, Oral, Every 6 hours PRN    REVIEW OF SYSTEMS: Review of Systems  Constitutional: Positive for malaise/fatigue.  Cardiovascular:  Negative for chest pain, claudication, dyspnea on exertion, irregular heartbeat, leg swelling, near-syncope, orthopnea, palpitations, paroxysmal nocturnal dyspnea and syncope.  Respiratory:  Negative for shortness of breath.   Hematologic/Lymphatic: Negative for bleeding problem.  Musculoskeletal:  Negative for muscle cramps and myalgias.  Neurological:  Negative for dizziness and light-headedness.  All other systems reviewed and are negative.   PHYSICAL EXAMINATION: PHYSICAL EXAM:    Intake/Output: No intake or output data in the 24 hours ending 06/26/22 2258   Net IO Since Admission: No IO data has been entered for this period [06/26/22 2258]  Weights:     05/07/2022    3:55 PM 04/22/2022    8:22 AM 03/25/2022    2:48 PM  Last 3 Weights  Weight (lbs) 228 lb 6.4 oz 226 lb 10.1 oz 226 lb 9.6 oz  Weight (kg) 103.602 kg 102.8 kg 102.785 kg     Physical Exam  Constitutional: No distress.  Age appropriate, hemodynamically stable.   Neck: No JVD present.  Cardiovascular: Normal rate, regular rhythm, S1 normal, S2 normal, intact distal pulses and normal pulses. Exam reveals no gallop, no S3 and no S4.  No murmur heard. Pulses:      Dorsalis pedis pulses are 2+ on the right side and 2+ on the left side.       Posterior tibial pulses are 2+ on the right side and 2+ on the left side.  Pulmonary/Chest: Effort normal and breath sounds normal. No stridor. She has no wheezes. She has no rales.  Abdominal: Soft. Bowel sounds are normal. She exhibits no distension. There is no abdominal tenderness.  Musculoskeletal:        General: No edema.     Cervical back:  Neck supple.  Neurological: She is alert and oriented to person, place, and time. She has intact cranial nerves (2-12).  Skin: Skin is warm and moist.   LAB RESULTS: Chemistry Recent Labs  Lab 06/25/22 1256  NA 140  K 3.8  CL 104  CO2 17*  GLUCOSE 125*  BUN 16  CREATININE 1.06*  CALCIUM 9.4  GFRNONAA 55*  ANIONGAP 19*    Hematology Recent Labs  Lab 06/25/22 1256  WBC 11.8*  RBC 5.32*  HGB 15.3*  HCT 46.5*  MCV 87.4  MCH 28.8  MCHC 32.9  RDW 16.0*  PLT 353   High Sensitivity Troponin:  No results for input(s): "TROPONINIHS" in the last 720 hours.   Cardiac EnzymesNo results for input(s): "TROPONINI" in the last 168 hours. No results for input(s): "TROPIPOC" in the last 168 hours.  BNPNo results for input(s): "BNP", "PROBNP" in the last 168 hours.  DDimer No results for input(s): "DDIMER" in the last 168 hours.  Hemoglobin A1c: No results found for: "HGBA1C", "MPG" TSH No results for input(s): "TSH" in the last 8760 hours. Lipid Panel  Lab  Results  Component Value Date   CHOL 126 10/26/2019   HDL 50 10/26/2019   LDLCALC 63 10/26/2019   TRIG 60 10/26/2019   Drugs of Abuse  No results found for: "LABOPIA", "COCAINSCRNUR", "LABBENZ", "AMPHETMU", "THCU", "LABBARB"    CARDIAC DATABASE: EKG: 06/25/2022: Supraventricular tachycardia, 182 bpm, likely AVNRT, ST-T changes in the high lateral and lateral leads due to rate related changes versus possible ischemia.  Echocardiogram: 07/06/2019: LVEF 55-60%, moderate left ventricular hypertrophy, indeterminate diastolic filling pattern, mild to moderately dilated left atrium, mild MAC, mild MR, mild TR.   TEE 04/22/2022:  LVEF 50 to 55%, normal right ventricular function, Moderately dilated left atrium, mild MR, moderate TR, mild aortic root dilatation 39 mm, aortic atherosclerosis, positive bubble study due to PFO.   Stress Testing:  Carlton Adam (Walking with mod Bruce)Tetrofosmin Stress Test  08/09/2019: Mild degree Moderate  extent perfusion defect consistent with mild (reversible) ischemia located in the mid to distal lateral wall (Left Circumflex Artery region) of left ventricle. Overall LV systolic function is normal without regional wall motion abnormalities. Stress LV EF: 75%. Low risk.   Coronary CTA: 08/24/2019: 1. Coronary calcium score of 248. This was 87th percentile for age and sex matched controls. 2. Normal coronary origin with right dominance.  3. Minimal, non-obstructive CAD (<25%).  4. Small PFO.  5. Moderate mitral annular calcification.  RECOMMENDATIONS: Minimal non-obstructive CAD (0-24%). Consider preventive therapy and risk factor modification.   Heart Catheterization: None   Transesophageal guided direct-current cardioversion: 04/22/2022: Converted to sinus bradycardia after 200 J x 1.  IMPRESSION & RECOMMENDATIONS: Kimberly Lamb is a 72 y.o. Caucasian female whose past medical history and cardiovascular risk factors include: Paroxysmal atrial fibrillation, moderate coronary artery calcification, nonobstructive CAD, obstructive sleep apnea on CPAP, benign essential hypertension, obesity, due to excess calories, postmenopausal female, advanced age.  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 Long-term antiarrhythmic medication. Nonobstructive CAD Coronary artery calcification. Hypertension.  Hyperlipidemia. Obesity due to excess calories  Plan:  Patient presented to the hospital and narrow complex tachycardia initial EKG concerning for possible SVT given a pseudo R wave in V1.  Had a discussion with the ED physician with regards to administering adenosine to see if she chemically converted to sinus rhythm.  After administering 6 mg followed by 12 mg she converted to normal sinus rhythm.  Post chemical cardioversion her ventricular rate improved so to the blood pressure.  She remains asymptomatic and denies  anginal discomfort or heart failure symptoms  In the past recommended converting flecainide to Multaq but it appears that Multaq is cost prohibitive.  Will increase flecainide to 100 mg p.o. twice daily and continue home dose of metoprolol.  Continue oral anticoagulation for thromboembolic prophylaxis.  Educated her on importance of secondary prevention with regards to nonobstructive CAD and coronary artery calcification.  Blood pressures have improved since correcting her underlying arrhythmia.  Continue home medications.  Will have the office reach out to her for outpatient follow-up visit.  However, if she does not hear back in the next 48 hours she is encouraged to call the office and have it arranged.   Total encounter time 60 minutes. *Total Encounter Time as defined by the Centers for Medicare and Medicaid Services includes, in addition to the face-to-face time of a patient visit (documented in the note above) non-face-to-face time: obtaining and reviewing outside history, ordering and reviewing medications, tests or procedures, care coordination (communications with other health care professionals or caregivers) and  documentation in the medical record.  Patient's questions and concerns were addressed to her satisfaction. She voices understanding of the instructions provided during this encounter.   This note was created using a voice recognition software as a result there may be grammatical errors inadvertently enclosed that do not reflect the nature of this encounter. Every attempt is made to correct such errors.  Rex Kras, [place], [facility]  Pager:  [phone] Office: [phone]

## 2022-06-25 NOTE — Telephone Encounter (Signed)
Spoke with patients daughter, the patients HR is fluctuating at 187-200 bpm. After speaking with Dr. Terri Skains it was decided that the patient should go to the ER. Patients daughter agreed and is taking her to [facility].

## 2022-06-25 NOTE — ED Notes (Signed)
Patient ambulated to restroom independently and returned to room. Noted to be in normal sinus rhythm with no distress or shortness of breath.

## 2022-06-25 NOTE — ED Triage Notes (Signed)
Pt reports she was at work when suddenly she felt her heart racing. HR 180 in triage. Hx of cardioversion in January. Pt c.o headache and arm pain, denies chest pain or SOB.

## 2022-06-25 NOTE — Discharge Instructions (Addendum)
Increase the flecainide to 100 mg twice a day.  Follow-up with Dr. Terri Skains.

## 2022-06-25 NOTE — ED Provider Notes (Addendum)
[facility] Provider Note   CSN: [id number] Arrival date & time: 06/25/22  1231     History  Chief Complaint  Patient presents with   Tachycardia    Kimberly Lamb is a 72 y.o. female.  HPI Patient presents with fast heart rate feels her heart racing.  History of atrial fibrillation.  Is on Eliquis and has been compliant.  Also on flecainide.  Had cardioversion about 2 months ago.  States she does not think she has been in it since, however cannot tell when she is in it.  Began to feel her heart racing this afternoon.  States her watch tells her when she is in A-fib and has not told her she has been in it since the cardioversion.   Past Medical History:  Diagnosis Date   Anxiety    Coronary artery calcification    Heart murmur    Hyperlipidemia    Hypertension    Nonobstructive atherosclerosis of coronary artery     Home Medications Prior to Admission medications   Medication Sig Start Date End Date Taking? Authorizing Provider  amLODipine (NORVASC) 10 MG tablet Take 1 tablet (10 mg total) by mouth daily. 05/02/20 05/07/22  Tolia, Sunit, DO  apixaban (ELIQUIS) 5 MG TABS tablet Take 1 tablet (5 mg total) by mouth 2 (two) times daily. 05/29/22   Tolia, Sunit, DO  aspirin 81 MG EC tablet Take 81 mg by mouth daily. Swallow whole.    [provider]  atorvastatin (LIPITOR) 20 MG tablet Take 1 tablet (20 mg total) by mouth at bedtime. 05/02/20 05/07/22  Tolia, Sunit, DO  citalopram (CELEXA) 40 MG tablet Take 40 mg by mouth daily.    [provider]  cyclobenzaprine (FLEXERIL) 5 MG tablet Take 5 mg by mouth 3 (three) times daily as needed for muscle spasms.    [provider]  dicyclomine (BENTYL) 20 MG tablet Take 20 mg by mouth 2 (two) times daily. 03/17/22   [provider]  flecainide (TAMBOCOR) 50 MG tablet Take 2 tablets (100 mg total) by mouth 2 (two) times daily. 06/25/22   Davonna Belling, MD   furosemide (LASIX) 20 MG tablet Take 0.5 tablets (10 mg total) by mouth in the morning. 05/02/20 05/07/22  Tolia, Sunit, DO  gabapentin (NEURONTIN) 100 MG capsule Take 100 mg by mouth 2 (two) times daily.    [provider]  hydrochlorothiazide (HYDRODIURIL) 25 MG tablet Take 1 tablet (25 mg total) by mouth in the morning. 05/02/20 05/07/22  Tolia, Sunit, DO  lisinopril (ZESTRIL) 40 MG tablet Take 1 tablet (40 mg total) by mouth daily. 05/02/20 05/07/22  Tolia, Sunit, DO  meloxicam (MOBIC) 15 MG tablet Take 15 mg by mouth daily.    [provider]  metoprolol tartrate (LOPRESSOR) 25 MG tablet Take 1 tablet (25 mg total) by mouth 2 (two) times daily. Hold if systolic blood pressure (top number) less than 100 mmHg or pulse less than 60 bpm. 05/07/22 08/05/22  Tolia, Sunit, DO  nitroGLYCERIN (NITROSTAT) 0.4 MG SL tablet Place 1 tablet (0.4 mg total) under the tongue every 5 (five) minutes as needed for chest pain. 08/16/19 05/07/22  Tolia, Sunit, DO  pantoprazole (PROTONIX) 40 MG tablet Take 40 mg by mouth 2 (two) times daily.    [provider]  promethazine (PHENERGAN) 25 MG suppository Place 25 mg rectally every 6 (six) hours as needed for nausea or vomiting.    [provider]  traMADol (  ULTRAM) 50 MG tablet Take 50 mg by mouth every 6 (six) hours as needed for severe pain.    [provider]      Allergies    Patient has no known allergies.    Review of Systems   Review of Systems  Physical Exam Updated Vital Signs BP 118/65   Pulse 88   Temp 97.8 F (36.6 C) (Oral)   Resp (!) 22   SpO2 95%  Physical Exam Vitals and nursing note reviewed.  Eyes:     Pupils: Pupils are equal, round, and reactive to light.  Cardiovascular:     Rate and Rhythm: Regular rhythm. Tachycardia present.  Pulmonary:     Breath sounds: No wheezing.  Abdominal:     Tenderness: There is no abdominal tenderness.  Musculoskeletal:        General: No swelling or tenderness.   Neurological:     Mental Status: She is alert and oriented to person, place, and time.     ED Results / Procedures / Treatments   Labs (all labs ordered are listed, but only abnormal results are displayed) Labs Reviewed  BASIC METABOLIC PANEL - Abnormal; Notable for the following components:      Result Value   CO2 17 (*)    Glucose, Bld 125 (*)    Creatinine, Ser 1.06 (*)    GFR, Estimated 55 (*)    Anion gap 19 (*)    All other components within normal limits  CBC - Abnormal; Notable for the following components:   WBC 11.8 (*)    RBC 5.32 (*)    Hemoglobin 15.3 (*)    HCT 46.5 (*)    RDW 16.0 (*)    All other components within normal limits  MAGNESIUM    EKG EKG Interpretation  Date/Time:  Thursday June 25 2022 12:45:16 EST Ventricular Rate:  182 PR Interval:    QRS Duration: 82 QT Interval:  272 QTC Calculation: 473 R Axis:   -31 Text Interpretation:  Critical Test Result: Arrhythmia Supraventricular tachycardia with frequent Premature ventricular complexes Left axis deviation Marked ST abnormality, possible lateral subendocardial injury Abnormal ECG When compared with ECG of 22-Apr-2022 09:44,  rate incresaed from prior Confirmed by Davonna Belling [phone]) on 06/25/2022 12:48:05 PM  Radiology DG Chest Portable 1 View  Result Date: 06/25/2022 CLINICAL DATA:  sob EXAM: PORTABLE CHEST - 1 VIEW COMPARISON:  03/11/2022 FINDINGS: Cardiac silhouette is prominent. There is pulmonary interstitial prominence with vascular congestion. No focal consolidation. No pneumothorax or pleural effusion identified. Aorta is calcified. IMPRESSION: Findings suggest CHF. Electronically Signed   By: Sammie Bench M.D.   On: 06/25/2022 13:50       Procedures Procedures    Medications Ordered in ED Medications  sodium chloride 0.9 % bolus 500 mL (0 mLs Intravenous Stopped 06/25/22 1444)  adenosine (ADENOCARD) 6 MG/2ML injection 6 mg (6 mg Intravenous Given 06/25/22 1316)  adenosine  (ADENOCARD) 6 MG/2ML injection 12 mg (12 mg Intravenous Given 06/25/22 1321)  flecainide (TAMBOCOR) tablet 50 mg (50 mg Oral Given 06/25/22 1438)    ED Course/ Medical Decision Making/ A&P                             Medical Decision Making Amount and/or Complexity of Data Reviewed Labs: ordered. Radiology: ordered.  Risk Prescription drug management.   Patient with tachycardia.  Likely atrial fibrillation versus SVT.Marland Kitchen  History of same.  Mild  hypotension.  Is on anticoagulation currently.  No clear cause.  Is on flecainide with plans to change over to Multaq.  Reviewed notes from cardiology.  Will check basic blood work.  Discussed with Dr. Terri Skains recommended adenosine since potentially SVT.  6 mg given with pause and brief return to sinus.  12 mg adenosine given with conversion back to sinus rhythm.  Remains in sinus rhythm.  Lab work reassuring.  Discussed with Dr. Terri Skains again.  Increase flecainide to 100 mg twice a day.  Follow-up as an outpatient.  CRITICAL CARE Performed by: Davonna Belling Total critical care time: 30 minutes Critical care time was exclusive of separately billable procedures and treating other patients. Critical care was necessary to treat or prevent imminent or life-threatening deterioration. Critical care was time spent personally by me on the following activities: development of treatment plan with patient and/or surrogate as well as nursing, discussions with consultants, evaluation of patient's response to treatment, examination of patient, obtaining history from patient or surrogate, ordering and performing treatments and interventions, ordering and review of laboratory studies, ordering and review of radiographic studies, pulse oximetry and re-evaluation of patient's condition.          Final Clinical Impression(s) / ED Diagnoses Final diagnoses:  SVT (supraventricular tachycardia)    Rx / DC Orders ED Discharge Orders          Ordered     flecainide (TAMBOCOR) 50 MG tablet  2 times daily        06/25/22 1445              Davonna Belling, MD 06/25/22 1447    Davonna Belling, MD 06/25/22 [phone]

## 2022-06-26 ENCOUNTER — Telehealth: Payer: Self-pay

## 2022-06-26 DIAGNOSIS — E782 Mixed hyperlipidemia: Secondary | ICD-10-CM

## 2022-06-26 DIAGNOSIS — Z7901 Long term (current) use of anticoagulants: Secondary | ICD-10-CM

## 2022-06-26 DIAGNOSIS — Z79899 Other long term (current) drug therapy: Secondary | ICD-10-CM

## 2022-06-26 DIAGNOSIS — I471 Supraventricular tachycardia, unspecified: Secondary | ICD-10-CM

## 2022-06-26 DIAGNOSIS — I2510 Atherosclerotic heart disease of native coronary artery without angina pectoris: Secondary | ICD-10-CM

## 2022-06-26 DIAGNOSIS — I480 Paroxysmal atrial fibrillation: Secondary | ICD-10-CM

## 2022-06-26 NOTE — Telephone Encounter (Signed)
I saw her in the hospital yesterday. Rice Walsh [place], DO, [facility]

## 2022-06-26 NOTE — Telephone Encounter (Signed)
Location of hospitalization: [facility] Reason for hospitalization: Palpations, SOB, and irregular heart rate  Date of discharge: 06/25/2022 Date of first communication with patient: today Person contacting patient: Me Current symptoms: None Do you understand why you were in the Hospital: Yes Questions regarding discharge instructions: None Where were you discharged to: Home Medications reviewed: Yes Allergies reviewed: Yes Dietary changes reviewed: Yes. Discussed low fat and low salt diet.  Referals reviewed: NA Activities of Daily Living: Able to with mild limitations Any transportation issues/concerns: None Any patient concerns: None Confirmed importance & date/time of Follow up appt: Yes Confirmed with patient if condition begins to worsen call. Pt was given the office number and encouraged to call back with questions or concerns: Yes

## 2022-07-01 ENCOUNTER — Other Ambulatory Visit: Payer: Self-pay | Admitting: Cardiology

## 2022-07-01 DIAGNOSIS — Z79899 Other long term (current) drug therapy: Secondary | ICD-10-CM

## 2022-07-01 DIAGNOSIS — I480 Paroxysmal atrial fibrillation: Secondary | ICD-10-CM

## 2022-07-01 DIAGNOSIS — Z7901 Long term (current) use of anticoagulants: Secondary | ICD-10-CM

## 2022-07-01 NOTE — Telephone Encounter (Signed)
Please call the patient that I have placed a referral for cardiac electrophysiology given her recurrent A-fib episodes.  Will discuss this further at the upcoming visit as well.  Lonni Dirden [place], DO, [facility]

## 2022-07-01 NOTE — Progress Notes (Signed)
Given the recurrence of atrial fibrillation despite prior cardioversions and antiarrhythmic medications will refer her to electrophysiology for consideration of other antiarrhythmic drugs versus catheter directed ablation.  Paroxysmal atrial fibrillation (Kimberly Lamb) - Plan: Ambulatory referral to Cardiac Electrophysiology  Long term (current) use of anticoagulants - Plan: Ambulatory referral to Cardiac Electrophysiology  Long term current use of antiarrhythmic drug - Plan: Ambulatory referral to Cardiac Electrophysiology  [name] [facility]  Pager:  [phone] Office: [phone]

## 2022-07-06 ENCOUNTER — Encounter: Payer: Self-pay | Admitting: Cardiology

## 2022-07-06 ENCOUNTER — Ambulatory Visit: Payer: Medicare Other | Admitting: Cardiology

## 2022-07-06 VITALS — BP 124/65 | HR 94 | Resp 16 | Ht 66.0 in | Wt 238.8 lb

## 2022-07-06 DIAGNOSIS — E782 Mixed hyperlipidemia: Secondary | ICD-10-CM

## 2022-07-06 DIAGNOSIS — I480 Paroxysmal atrial fibrillation: Secondary | ICD-10-CM

## 2022-07-06 DIAGNOSIS — I10 Essential (primary) hypertension: Secondary | ICD-10-CM

## 2022-07-06 DIAGNOSIS — I2510 Atherosclerotic heart disease of native coronary artery without angina pectoris: Secondary | ICD-10-CM

## 2022-07-06 DIAGNOSIS — G4730 Sleep apnea, unspecified: Secondary | ICD-10-CM

## 2022-07-06 DIAGNOSIS — Q2112 Patent foramen ovale: Secondary | ICD-10-CM

## 2022-07-06 DIAGNOSIS — Z7901 Long term (current) use of anticoagulants: Secondary | ICD-10-CM

## 2022-07-06 DIAGNOSIS — E6609 Other obesity due to excess calories: Secondary | ICD-10-CM

## 2022-07-06 DIAGNOSIS — I471 Supraventricular tachycardia, unspecified: Secondary | ICD-10-CM

## 2022-07-06 DIAGNOSIS — Z79899 Other long term (current) drug therapy: Secondary | ICD-10-CM

## 2022-07-06 MED ORDER — DILTIAZEM HCL ER COATED BEADS 180 MG PO CP24: 180.0000 mg | ORAL_CAPSULE | Freq: Every day | ORAL | 0 refills | Status: DC

## 2022-07-06 NOTE — Progress Notes (Signed)
Kimberly Lamb Date of Birth: Nov 25, 1950 MRN: [id number] Primary Care Provider:[place], Council Mechanic, FNP Primary Cardiologist: Rex Kras, DO (established care 06/30/2019)  Date: 07/06/22 Last Office Visit: 05/07/2022  Chief Complaint  Patient presents with   Follow-up    ER visit follow-up. Paroxysmal atrial fibrillation, SVT   HPI  Kimberly Lamb is a 72 y.o. female whose past medical history and cardiac risk factors include: Paroxysmal atrial fibrillation, moderate coronary artery calcification, nonobstructive CAD, obstructive sleep apnea on CPAP, benign essential hypertension, obesity, due to excess calories, postmenopausal female, advanced age.  Patient is being followed by the practice for paroxysmal atrial fibrillation, coronary artery calcification, and minimal nonobstructive CAD.  Patient remained in persistent symptomatic atrial fibrillation despite being on AV nodal blocking agents and antiarrhythmics subsequently underwent TEE guided cardioversion January 2024.  She did well for a short period of time until recently had an ED visit in April 2024 for tachycardia.  On arrival to the ED surface ECG noted narrow complex tachycardia suggestive of SVT/AVNRT.  Recommended administering adenosine which eventually converted her to sinus rhythm.  Patient's flecainide dose was increased to 100 mg p.o. twice daily with continuation of metoprolol.  She presents today for follow-up.  She remains in sinus rhythm but feels tired, fatigued, decreased energy, feels sleep deprived.  Unfortunately, has gained 10 pounds since January 2024.  She recently had her Lexapro changed to Paxil since last office visit.  Patient states that Eliquis is becoming cost prohibitive.   ALLERGIES: No Known Allergies  MEDICATION LIST PRIOR TO VISIT: Current Outpatient Medications on File Prior to Visit  Medication Sig Dispense Refill   apixaban (ELIQUIS) 5 MG TABS tablet Take 1 tablet (5 mg total) by mouth 2 (two)  times daily. 180 tablet 3   aspirin 81 MG EC tablet Take 81 mg by mouth daily. Swallow whole.     atorvastatin (LIPITOR) 20 MG tablet Take 1 tablet (20 mg total) by mouth at bedtime. 90 tablet 1   citalopram (CELEXA) 40 MG tablet Take 40 mg by mouth 2 (two) times daily as needed.     cyclobenzaprine (FLEXERIL) 5 MG tablet Take 5 mg by mouth 3 (three) times daily as needed for muscle spasms.     dicyclomine (BENTYL) 20 MG tablet Take 20 mg by mouth 2 (two) times daily.     flecainide (TAMBOCOR) 50 MG tablet Take 2 tablets (100 mg total) by mouth 2 (two) times daily. 180 tablet 0   hydrochlorothiazide (HYDRODIURIL) 25 MG tablet Take 1 tablet (25 mg total) by mouth in the morning. 90 tablet 1   lisinopril (ZESTRIL) 40 MG tablet Take 1 tablet (40 mg total) by mouth daily. 90 tablet 1   meloxicam (MOBIC) 15 MG tablet Take 15 mg by mouth daily.     nitroGLYCERIN (NITROSTAT) 0.4 MG SL tablet Place 1 tablet (0.4 mg total) under the tongue every 5 (five) minutes as needed for chest pain. 90 tablet 3   pantoprazole (PROTONIX) 40 MG tablet Take 40 mg by mouth 2 (two) times daily.     PARoxetine (PAXIL) 30 MG tablet Take 30 mg by mouth daily.     promethazine (PHENERGAN) 25 MG suppository Place 25 mg rectally every 6 (six) hours as needed for nausea or vomiting.     traMADol (ULTRAM) 50 MG tablet Take 50 mg by mouth every 6 (six) hours as needed for severe pain.     furosemide (LASIX) 20 MG tablet Take 0.5 tablets (10 mg total) by mouth  in the morning. (Patient not taking: Reported on 07/06/2022) 45 tablet 0   gabapentin (NEURONTIN) 100 MG capsule Take 100 mg by mouth 2 (two) times daily.     No current facility-administered medications on file prior to visit.    PAST MEDICAL HISTORY: Past Medical History:  Diagnosis Date   Anxiety    Coronary artery calcification    Heart murmur    Hyperlipidemia    Hypertension    Nonobstructive atherosclerosis of coronary artery     PAST SURGICAL HISTORY: Past  Surgical History:  Procedure Laterality Date   ACHILLES TENDON SURGERY  2010   BUBBLE STUDY  04/22/2022   Procedure: BUBBLE STUDY;  Surgeon: Rex Kras, DO;  Location: [facility];  Service: Cardiovascular;;   CARDIOVERSION N/A 04/22/2022   Procedure: CARDIOVERSION;  Surgeon: Rex Kras, DO;  Location: [facility];  Service: Cardiovascular;  Laterality: N/A;   knee replacement Right 2009   x3 2009, 2012, 2014   ROTATOR CUFF REPAIR  2011   Right   SPINAL FUSION     l3   TEE WITHOUT CARDIOVERSION N/A 04/22/2022   Procedure: TRANSESOPHAGEAL ECHOCARDIOGRAM (TEE);  Surgeon: Rex Kras, DO;  Location: [facility];  Service: Cardiovascular;  Laterality: N/A;   VAGINAL HYSTERECTOMY  1986    FAMILY HISTORY: The patient family history includes Heart attack in her father; Heart failure in her father.   SOCIAL HISTORY:  The patient  reports that she has never smoked. She has never used smokeless tobacco. She reports that she does not drink alcohol and does not use drugs.  Review of Systems  Constitutional: Positive for malaise/fatigue and weight gain.  Cardiovascular:  Negative for chest pain, claudication, dyspnea on exertion, irregular heartbeat, leg swelling, near-syncope, orthopnea, palpitations, paroxysmal nocturnal dyspnea and syncope.  Respiratory:  Positive for snoring. Negative for shortness of breath.   Hematologic/Lymphatic: Negative for bleeding problem.  Musculoskeletal:  Negative for muscle cramps and myalgias.  Neurological:  Negative for dizziness and light-headedness.   PHYSICAL EXAM:    07/06/2022    3:15 PM 07/06/2022    3:02 PM 06/25/2022    2:45 PM  Vitals with BMI  Height  5\' 6"    Weight  238 lbs 13 oz   BMI  88.41   Systolic 660 630 160  Diastolic 65 82 65  Pulse 94 60 88   Physical Exam  Constitutional: No distress.  Age appropriate, hemodynamically stable.   Neck: No JVD present.  Cardiovascular: Regular rhythm, S1 normal, S2 normal, intact distal pulses  and normal pulses. Bradycardia present. Exam reveals no gallop, no S3 and no S4.  No murmur heard. Pulses:      Popliteal pulses are 2+ on the right side and 2+ on the left side.       Dorsalis pedis pulses are 2+ on the right side and 2+ on the left side.  Pulmonary/Chest: Effort normal and breath sounds normal. No stridor. She has no wheezes. She has no rales.  Abdominal: Soft. Bowel sounds are normal. She exhibits no distension. There is no abdominal tenderness.  Musculoskeletal:        General: No edema.     Cervical back: Neck supple.  Neurological: She is alert and oriented to person, place, and time. She has intact cranial nerves (2-12).  Skin: Skin is warm and moist.   CARDIAC DATABASE: EKG: 07/06/2022: Sinus bradycardia, 56 bpm, without underlying ischemia or injury pattern.  Echocardiogram: 07/06/2019: LVEF 55-60%, moderate left ventricular hypertrophy, indeterminate diastolic filling  pattern, mild to moderately dilated left atrium, mild MAC, mild MR, mild TR.  TEE 04/22/2022:  LVEF 50 to 55%, normal right ventricular function, Moderately dilated left atrium, mild MR, moderate TR, mild aortic root dilatation 39 mm, aortic atherosclerosis, positive bubble study due to PFO.  Stress Testing:  Carlton Adam (Walking with mod Bruce)Tetrofosmin Stress Test  08/09/2019: Mild degree Moderate extent perfusion defect consistent with mild (reversible) ischemia located in the mid to distal lateral wall (Left Circumflex Artery region) of left ventricle. Overall LV systolic function is normal without regional wall motion abnormalities. Stress LV EF: 75%. Low risk.  Coronary CTA: 08/24/2019: 1. Coronary calcium score of 248. This was 87th percentile for age and sex matched controls. 2. Normal coronary origin with right dominance.  3. Minimal, non-obstructive CAD (<25%).  4. Small PFO.  5. Moderate mitral annular calcification.  RECOMMENDATIONS: Minimal non-obstructive CAD (0-24%). Consider  preventive therapy and risk factor modification.  Heart Catheterization: None  Cardioversion: 04/22/2022: TEE guided cardioversion: Converted to sinus bradycardia after 200 J x 1. 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   LABORATORY DATA: External Labs: Collected: 05/26/2021 provided by Dr. Thornton Papas Hemoglobin 14.6 g/dL, hematocrit 45.5% BUN 24, creatinine 0.83. Sodium 140, potassium 4.4, chloride 108, bicarb 30 AST 11, ALT 20, alkaline phosphatase 72. Total cholesterol 145, triglycerides 135, HDL 52, LDL 67 TSH 1.02     Latest Ref Rng & Units 06/25/2022   12:56 PM 04/22/2022    8:42 AM  CBC  WBC 4.0 - 10.5 K/uL 11.8    Hemoglobin 12.0 - 15.0 g/dL 15.3  14.3   Hematocrit 36.0 - 46.0 % 46.5  42.0   Platelets 150 - 400 K/uL 353        Latest Ref Rng & Units 06/25/2022   12:56 PM 04/22/2022    8:42 AM 08/17/2019    9:46 AM  BMP  Glucose 70 - 99 mg/dL 125  107  100   BUN 8 - 23 mg/dL 16  25  17    Creatinine 0.44 - 1.00 mg/dL 1.06  0.70  0.74   BUN/Creat Ratio 12 - 28   23   Sodium 135 - 145 mmol/L 140  142  142   Potassium 3.5 - 5.1 mmol/L 3.8  4.1  4.3   Chloride 98 - 111 mmol/L 104  107  102   CO2 22 - 32 mmol/L 17   23   Calcium 8.9 - 10.3 mg/dL 9.4   9.4     FINAL MEDICATION LIST END OF ENCOUNTER: Meds ordered this encounter  Medications   diltiazem (CARDIZEM CD) 180 MG 24 hr capsule    Sig: Take 1 capsule (180 mg total) by mouth daily.    Dispense:  90 capsule    Refill:  0    Medications Discontinued During This Encounter  Medication Reason   amLODipine (NORVASC) 10 MG tablet Change in therapy   metoprolol tartrate (LOPRESSOR) 25 MG tablet Change in therapy      Current Outpatient Medications:    apixaban (ELIQUIS) 5 MG TABS tablet, Take 1 tablet (5 mg total) by mouth 2 (two) times daily., Disp: 180 tablet, Rfl: 3   aspirin 81 MG EC tablet, Take 81 mg by mouth daily. Swallow whole., Disp: , Rfl:    atorvastatin (LIPITOR) 20 MG  tablet, Take 1 tablet (20 mg total) by mouth at bedtime., Disp: 90 tablet, Rfl: 1   citalopram (CELEXA) 40 MG tablet, Take 40 mg by  mouth 2 (two) times daily as needed., Disp: , Rfl:    cyclobenzaprine (FLEXERIL) 5 MG tablet, Take 5 mg by mouth 3 (three) times daily as needed for muscle spasms., Disp: , Rfl:    dicyclomine (BENTYL) 20 MG tablet, Take 20 mg by mouth 2 (two) times daily., Disp: , Rfl:    diltiazem (CARDIZEM CD) 180 MG 24 hr capsule, Take 1 capsule (180 mg total) by mouth daily., Disp: 90 capsule, Rfl: 0   flecainide (TAMBOCOR) 50 MG tablet, Take 2 tablets (100 mg total) by mouth 2 (two) times daily., Disp: 180 tablet, Rfl: 0   hydrochlorothiazide (HYDRODIURIL) 25 MG tablet, Take 1 tablet (25 mg total) by mouth in the morning., Disp: 90 tablet, Rfl: 1   lisinopril (ZESTRIL) 40 MG tablet, Take 1 tablet (40 mg total) by mouth daily., Disp: 90 tablet, Rfl: 1   meloxicam (MOBIC) 15 MG tablet, Take 15 mg by mouth daily., Disp: , Rfl:    nitroGLYCERIN (NITROSTAT) 0.4 MG SL tablet, Place 1 tablet (0.4 mg total) under the tongue every 5 (five) minutes as needed for chest pain., Disp: 90 tablet, Rfl: 3   pantoprazole (PROTONIX) 40 MG tablet, Take 40 mg by mouth 2 (two) times daily., Disp: , Rfl:    PARoxetine (PAXIL) 30 MG tablet, Take 30 mg by mouth daily., Disp: , Rfl:    promethazine (PHENERGAN) 25 MG suppository, Place 25 mg rectally every 6 (six) hours as needed for nausea or vomiting., Disp: , Rfl:    traMADol (ULTRAM) 50 MG tablet, Take 50 mg by mouth every 6 (six) hours as needed for severe pain., Disp: , Rfl:    furosemide (LASIX) 20 MG tablet, Take 0.5 tablets (10 mg total) by mouth in the morning. (Patient not taking: Reported on 07/06/2022), Disp: 45 tablet, Rfl: 0   gabapentin (NEURONTIN) 100 MG capsule, Take 100 mg by mouth 2 (two) times daily., Disp: , Rfl:   IMPRESSION:    ICD-10-CM   1. Paroxysmal atrial fibrillation (HCC)  I48.0 diltiazem (CARDIZEM CD) 180 MG 24 hr capsule     [facility] CARDIAC STRESS TEST    Ambulatory referral to Sleep Studies    2. Long term (current) use of anticoagulants  Z79.01     3. Sleep apnea in adult  G47.30 Ambulatory referral to Sleep Studies    4. Long term current use of antiarrhythmic drug  Z79.899     5. SVT (supraventricular tachycardia)  I47.10 EKG 12-Lead    6. Coronary artery calcification seen on computed tomography  I25.10     7. Nonobstructive atherosclerosis of coronary artery  I25.10     8. PFO (patent foramen ovale)  Q21.12     9. Benign hypertension  I10     10. Mixed hyperlipidemia  E78.2     11. Class 2 obesity due to excess calories without serious comorbidity with body mass index (BMI) of 38.0 to 38.9 in adult  E66.09    Z68.38        RECOMMENDATIONS: Kimberly Lamb is a 72 y.o. female whose past medical history and cardiac risk factors include: Paroxysmal atrial fibrillation, moderate coronary artery calcification, nonobstructive CAD, obstructive sleep apnea on CPAP, benign essential hypertension, obesity, due to excess calories, postmenopausal female, advanced age.  Paroxysmal atrial fibrillation (HCC) Rate control: Diltiazem. Rhythm control: Flecainide. Thromboembolic prophylaxis: Eliquis. CHA2DS2-VASc SCORE is 4 which correlates to 4.8 % risk of stroke per year (age, HTN, aortic atherosclerosis). Underwent TEE guided cardioversion 04/22/2022 200 J x  1. Will discontinue Lopressor 25 mg p.o. twice daily. Transition to diltiazem 180 mg p.o. daily Referred her to EP for consideration of different antiarrhythmic versus catheter directed ablation. Will refer her to sleep medicine to be reevaluated for the severity of sleep apnea and the need for possible device up titration or nocturnal oxygen need.  Long term (current) use of anticoagulants Indication: Paroxysmal atrial fibrillation Provided patient samples for Eliquis (4 weeks). Her patient assistance form is still being processed.  Encouraged her to  call her insurance company to see if Xarelto is on the formulary.   Long term current use of antiarrhythmic drug Indication: Paroxysmal atrial fibrillation. In the past considered transition to [facility] but it was cost prohibitive Since going up on flecainide as of June 25, 2022 recommend GXT to evaluate for exercise-induced arrhythmia.  Sleep apnea: Compliant with daily device usage Does not have a sleep provider that she follows. [employer] currently replacing her parts as needed. Given her recurrence of A-fib, her feeling tired, fatigued, reduced energy, I suspect that she may not be getting adequate support with the current device.  She probably needs to be evaluated for nocturnal hypoxia and/or up titration of current CPAP settings and/or the role of BiPAP.  Will refer her to Dr. Reece Levy at [facility].  Nonobstructive atherosclerosis of coronary artery Coronary artery calcification Continue aspirin and statin therapy. CADS-RAD 1 based on last CCTA.  Denies anginal discomfort. Reemphasized importance of secondary prevention.  Patent foramen ovale: Noted on coronary CTA as well as a TEE. Currently asymptomatic Monitor for now.   Benign hypertension Office blood pressures are well-controlled. Discontinue amlodipine. Start diltiazem as noted above Monitor for now.  Mixed hyperlipidemia Currently on atorvastatin.   She denies myalgia or other side effects. Currently managed by primary care provider.  Class 2 obesity due to excess calories without serious comorbidity with body mass index (BMI) of 38.0 to 38.9 in adult Body mass index is 38.54 kg/m. I reviewed with her importance of diet, regular physical activity/exercise, weight loss.   Approximately 10 pounds of weight gain since last office visit. Clinically appears to be euvolemic. Could be medication induced due to her psychotropic meds. I have asked her to discuss with her insurance company to see if weight loss program  informed the covered benefits.  Patient is educated on the importance of increasing physical activity gradually as tolerated with a goal of moderate intensity exercise for 30 minutes a day 5 days a week.  Orders Placed This Encounter  Procedures   Ambulatory referral to Sleep Studies   PCV CARDIAC STRESS TEST   EKG 12-Lead   --Continue cardiac medications as reconciled in final medication list. --Return in about 6 weeks (around 08/17/2022) for Follow up, A. fib. Or sooner if needed. --Continue follow-up with your primary care physician regarding the management of your other chronic comorbid conditions.  Patient's questions and concerns were addressed to her satisfaction. She voices understanding of the instructions provided during this encounter.   This note was created using a voice recognition software as a result there may be grammatical errors inadvertently enclosed that do not reflect the nature of this encounter. Every attempt is made to correct such errors.  Rex Kras, [place], [facility]  Pager:  [phone] Office: [phone]

## 2022-07-07 NOTE — Telephone Encounter (Signed)
Dr. Terri Skains has placed a order for cardiac electrophysiology due to A-Fib

## 2022-07-20 ENCOUNTER — Ambulatory Visit: Payer: Medicare Other

## 2022-07-20 DIAGNOSIS — I480 Paroxysmal atrial fibrillation: Secondary | ICD-10-CM

## 2022-07-22 ENCOUNTER — Ambulatory Visit: Payer: No Typology Code available for payment source | Admitting: Cardiology

## 2022-07-23 NOTE — Progress Notes (Signed)
Reminded patient of 4/30 appt, and let her know Elmo Putt is working on her Multaq.

## 2022-07-27 ENCOUNTER — Other Ambulatory Visit: Payer: Self-pay | Admitting: Cardiology

## 2022-07-27 DIAGNOSIS — I480 Paroxysmal atrial fibrillation: Secondary | ICD-10-CM

## 2022-08-11 ENCOUNTER — Ambulatory Visit: Payer: Medicare Other | Admitting: Cardiology

## 2022-08-15 ENCOUNTER — Other Ambulatory Visit: Payer: Self-pay | Admitting: Cardiology

## 2022-08-17 NOTE — Telephone Encounter (Signed)
Refill request

## 2022-08-18 ENCOUNTER — Ambulatory Visit: Payer: Medicare Other | Admitting: Cardiology

## 2022-08-19 ENCOUNTER — Telehealth: Payer: Self-pay

## 2022-08-19 ENCOUNTER — Encounter (HOSPITAL_COMMUNITY): Payer: Self-pay | Admitting: *Deleted

## 2022-08-19 ENCOUNTER — Other Ambulatory Visit: Payer: Self-pay

## 2022-08-19 ENCOUNTER — Emergency Department (HOSPITAL_COMMUNITY): Payer: Medicare Other

## 2022-08-19 ENCOUNTER — Inpatient Hospital Stay (HOSPITAL_COMMUNITY)
Admission: EM | Admit: 2022-08-19 | Discharge: 2022-08-22 | DRG: 309 | Disposition: A | Discharge status: Home / Self Care | Payer: Medicare Other | Attending: Cardiology | Admitting: Cardiology

## 2022-08-19 DIAGNOSIS — F419 Anxiety disorder, unspecified: Secondary | ICD-10-CM | POA: Diagnosis present

## 2022-08-19 DIAGNOSIS — M7989 Other specified soft tissue disorders: Secondary | ICD-10-CM

## 2022-08-19 DIAGNOSIS — Z7901 Long term (current) use of anticoagulants: Secondary | ICD-10-CM

## 2022-08-19 DIAGNOSIS — E785 Hyperlipidemia, unspecified: Secondary | ICD-10-CM | POA: Diagnosis present

## 2022-08-19 DIAGNOSIS — Z90710 Acquired absence of both cervix and uterus: Secondary | ICD-10-CM

## 2022-08-19 DIAGNOSIS — I4891 Unspecified atrial fibrillation: Secondary | ICD-10-CM | POA: Diagnosis not present

## 2022-08-19 DIAGNOSIS — Z9889 Other specified postprocedural states: Secondary | ICD-10-CM

## 2022-08-19 DIAGNOSIS — I4892 Unspecified atrial flutter: Secondary | ICD-10-CM | POA: Diagnosis present

## 2022-08-19 DIAGNOSIS — Z981 Arthrodesis status: Secondary | ICD-10-CM

## 2022-08-19 DIAGNOSIS — I4819 Other persistent atrial fibrillation: Principal | ICD-10-CM | POA: Diagnosis present

## 2022-08-19 DIAGNOSIS — Z1152 Encounter for screening for COVID-19: Secondary | ICD-10-CM

## 2022-08-19 DIAGNOSIS — Z8249 Family history of ischemic heart disease and other diseases of the circulatory system: Secondary | ICD-10-CM

## 2022-08-19 DIAGNOSIS — E782 Mixed hyperlipidemia: Secondary | ICD-10-CM | POA: Diagnosis present

## 2022-08-19 DIAGNOSIS — Q2112 Patent foramen ovale: Secondary | ICD-10-CM | POA: Insufficient documentation

## 2022-08-19 DIAGNOSIS — Z6836 Body mass index (BMI) 36.0-36.9, adult: Secondary | ICD-10-CM

## 2022-08-19 DIAGNOSIS — Z791 Long term (current) use of non-steroidal anti-inflammatories (NSAID): Secondary | ICD-10-CM

## 2022-08-19 DIAGNOSIS — Z9289 Personal history of other medical treatment: Secondary | ICD-10-CM

## 2022-08-19 DIAGNOSIS — I2510 Atherosclerotic heart disease of native coronary artery without angina pectoris: Secondary | ICD-10-CM | POA: Diagnosis present

## 2022-08-19 DIAGNOSIS — Z7982 Long term (current) use of aspirin: Secondary | ICD-10-CM

## 2022-08-19 DIAGNOSIS — Z79899 Other long term (current) drug therapy: Secondary | ICD-10-CM

## 2022-08-19 DIAGNOSIS — E6609 Other obesity due to excess calories: Secondary | ICD-10-CM | POA: Diagnosis present

## 2022-08-19 DIAGNOSIS — I10 Essential (primary) hypertension: Secondary | ICD-10-CM | POA: Diagnosis present

## 2022-08-19 DIAGNOSIS — G4733 Obstructive sleep apnea (adult) (pediatric): Secondary | ICD-10-CM | POA: Diagnosis present

## 2022-08-19 DIAGNOSIS — Z96651 Presence of right artificial knee joint: Secondary | ICD-10-CM | POA: Diagnosis present

## 2022-08-19 LAB — BRAIN NATRIURETIC PEPTIDE: B Natriuretic Peptide: 209.2 pg/mL — ABNORMAL HIGH (ref 0.0–100.0)

## 2022-08-19 LAB — BASIC METABOLIC PANEL
Anion gap: 10 (ref 5–15)
BUN: 24 mg/dL — ABNORMAL HIGH (ref 8–23)
CO2: 22 mmol/L (ref 22–32)
Calcium: 8.9 mg/dL (ref 8.9–10.3)
Chloride: 106 mmol/L (ref 98–111)
Creatinine, Ser: 0.86 mg/dL (ref 0.44–1.00)
GFR, Estimated: >60 mL/min (ref >60)
Glucose, Bld: 109 mg/dL — ABNORMAL HIGH (ref 70–99)
Potassium: 3.8 mmol/L (ref 3.5–5.1)
Sodium: 138 mmol/L (ref 135–145)

## 2022-08-19 LAB — CBC
HCT: 41.1 % (ref 36.0–46.0)
Hemoglobin: 13.2 g/dL (ref 12.0–15.0)
MCH: 28.1 pg (ref 26.0–34.0)
MCHC: 32.1 g/dL (ref 30.0–36.0)
MCV: 87.6 fL (ref 80.0–100.0)
Platelets: 280 10*3/uL (ref 150–400)
RBC: 4.69 MIL/uL (ref 3.87–5.11)
RDW: 14 % (ref 11.5–15.5)
WBC: 9.6 10*3/uL (ref 4.0–10.5)
nRBC: 0 % (ref 0.0–0.2)

## 2022-08-19 LAB — TROPONIN I (HIGH SENSITIVITY)
Troponin I (High Sensitivity): 13 ng/L (ref <18)
Troponin I (High Sensitivity): 14 ng/L (ref <18)

## 2022-08-19 LAB — D-DIMER, QUANTITATIVE: D-Dimer, Quant: 0.48 ug/mL-FEU (ref 0.00–0.50)

## 2022-08-19 LAB — TSH: TSH: 1.543 u[IU]/mL (ref 0.350–4.500)

## 2022-08-19 LAB — RESP PANEL BY RT-PCR (RSV, FLU A&B, COVID)  RVPGX2
Influenza A by PCR: NEGATIVE
Influenza B by PCR: NEGATIVE
Resp Syncytial Virus by PCR: NEGATIVE
SARS Coronavirus 2 by RT PCR: NEGATIVE

## 2022-08-19 MED ORDER — ONDANSETRON HCL 4 MG/2ML IJ SOLN: 4.0000 mg | Freq: Four times a day (QID) | INTRAMUSCULAR | Status: DC | PRN

## 2022-08-19 MED ORDER — PAROXETINE HCL 30 MG PO TABS
30.0000 mg | ORAL_TABLET | Freq: Every day | ORAL | Status: DC
  Administered 2022-08-20 – 2022-08-22 (×3): 30 mg via ORAL
  Filled 2022-08-19 (×3): qty 1

## 2022-08-19 MED ORDER — APIXABAN 5 MG PO TABS
5.0000 mg | ORAL_TABLET | Freq: Two times a day (BID) | ORAL | Status: DC
  Administered 2022-08-19: 5 mg via ORAL

## 2022-08-19 MED ORDER — ATORVASTATIN CALCIUM 10 MG PO TABS
20.0000 mg | ORAL_TABLET | Freq: Every day | ORAL | Status: DC
  Administered 2022-08-20 – 2022-08-21 (×2): 20 mg via ORAL
  Filled 2022-08-19 (×2): qty 2

## 2022-08-19 MED ORDER — POTASSIUM CHLORIDE CRYS ER 20 MEQ PO TBCR
40.0000 meq | EXTENDED_RELEASE_TABLET | Freq: Once | ORAL | Status: AC
  Administered 2022-08-19: 40 meq via ORAL
  Filled 2022-08-19: qty 2

## 2022-08-19 MED ORDER — DILTIAZEM HCL-DEXTROSE 125-5 MG/125ML-% IV SOLN (PREMIX)
5.0000 mg/h | INTRAVENOUS | Status: DC
  Administered 2022-08-19: 5 mg/h via INTRAVENOUS
  Administered 2022-08-20: 7.5 mg/h via INTRAVENOUS
  Filled 2022-08-19 (×2): qty 125

## 2022-08-19 MED ORDER — ACETAMINOPHEN 325 MG PO TABS: 650.0000 mg | ORAL_TABLET | ORAL | Status: DC | PRN

## 2022-08-19 MED ORDER — PANTOPRAZOLE SODIUM 40 MG PO TBEC
40.0000 mg | DELAYED_RELEASE_TABLET | Freq: Two times a day (BID) | ORAL | Status: DC
  Administered 2022-08-19 – 2022-08-22 (×6): 40 mg via ORAL
  Filled 2022-08-19 (×6): qty 1

## 2022-08-19 MED ORDER — LISINOPRIL 20 MG PO TABS
40.0000 mg | ORAL_TABLET | Freq: Every day | ORAL | Status: DC
  Administered 2022-08-19 – 2022-08-22 (×4): 40 mg via ORAL
  Filled 2022-08-19 (×4): qty 2

## 2022-08-19 MED ORDER — APIXABAN 5 MG PO TABS
5.0000 mg | ORAL_TABLET | Freq: Two times a day (BID) | ORAL | Status: DC
  Administered 2022-08-20 – 2022-08-22 (×5): 5 mg via ORAL
  Filled 2022-08-19 (×6): qty 1

## 2022-08-19 MED ORDER — HYDROCHLOROTHIAZIDE 25 MG PO TABS
25.0000 mg | ORAL_TABLET | Freq: Every morning | ORAL | Status: DC
  Administered 2022-08-20: 25 mg via ORAL
  Filled 2022-08-19: qty 1

## 2022-08-19 NOTE — ED Triage Notes (Signed)
The pt is here with an af rhy all day some sob with exertion  no feet or ankles swollen   no pain  hx od the same last cardioverted jan 3rd

## 2022-08-19 NOTE — ED Notes (Signed)
ED TO INPATIENT HANDOFF REPORT  ED Nurse Name and Phone #: Jesse Sans, [phone]  S Name/Age/Gender Kimberly Lamb 72 y.o. female Room/Bed: 032C/032C  Code Status   Code Status: Full Code  Home/SNF/Other Home Patient oriented to: self, place, time, and situation Is this baseline? Yes   Triage Complete: Triage complete  Chief Complaint Atrial fibrillation with RVR (HCC) [I48.91]  Triage Note The pt is here with an af rhy all day some sob with exertion  no feet or ankles swollen   no pain  hx od the same last cardioverted jan 3rd   Allergies No Known Allergies  Level of Care/Admitting Diagnosis ED Disposition     ED Disposition  Admit   Condition  --   Comment  Hospital Area: [facility] [[id number]]  Level of Care: Progressive [102]  Admit to Progressive based on following criteria: CARDIOVASCULAR & THORACIC of moderate stability with acute coronary syndrome symptoms/low risk myocardial infarction/hypertensive urgency/arrhythmias/heart failure potentially compromising stability and stable post cardiovascular intervention patients.  May place patient in observation at [facility] or [facility] if equivalent level of care is available:: Yes  Covid Evaluation: Confirmed COVID Negative  Diagnosis: Atrial fibrillation with RVR [facility]) [[id number]]  Admitting Physician: Tessa Lerner [[id number]]  Attending Physician: Tessa Lerner [[id number]]          B Medical/Surgery History Past Medical History:  Diagnosis Date   Anxiety    Coronary artery calcification    Heart murmur    Hyperlipidemia    Hypertension    Nonobstructive atherosclerosis of coronary artery    Past Surgical History:  Procedure Laterality Date   ACHILLES TENDON SURGERY  2010   BUBBLE STUDY  04/22/2022   Procedure: BUBBLE STUDY;  Surgeon: Tessa Lerner, DO;  Location: [facility] ENDOSCOPY;  Service: Cardiovascular;;   CARDIOVERSION N/A 04/22/2022   Procedure: CARDIOVERSION;  Surgeon: Tessa Lerner, DO;   Location: [facility] ENDOSCOPY;  Service: Cardiovascular;  Laterality: N/A;   knee replacement Right 2009   x3 2009, 2012, 2014   ROTATOR CUFF REPAIR  2011   Right   SPINAL FUSION     l3   TEE WITHOUT CARDIOVERSION N/A 04/22/2022   Procedure: TRANSESOPHAGEAL ECHOCARDIOGRAM (TEE);  Surgeon: Tessa Lerner, DO;  Location: [facility] ENDOSCOPY;  Service: Cardiovascular;  Laterality: N/A;   VAGINAL HYSTERECTOMY  1986     A IV Location/Drains/Wounds Patient Lines/Drains/Airways Status     Active Line/Drains/Airways     Name Placement date Placement time Site Days   Peripheral IV 08/19/22 20 G Anterior;Distal;Right;Upper Antecubital 08/19/22  1559  Antecubital  less than 1            Intake/Output Last 24 hours No intake or output data in the 24 hours ending 08/19/22 1917  Labs/Imaging Results for orders placed or performed during the hospital encounter of 08/19/22 (from the past 48 hour(s))  Basic metabolic panel     Status: Abnormal   Collection Time: 08/19/22  4:00 PM  Result Value Ref Range   Sodium 138 135 - 145 mmol/L   Potassium 3.8 3.5 - 5.1 mmol/L   Chloride 106 98 - 111 mmol/L   CO2 22 22 - 32 mmol/L   Glucose, Bld 109 (H) 70 - 99 mg/dL    Comment: Glucose reference range applies only to samples taken after fasting for at least 8 hours.   BUN 24 (H) 8 - 23 mg/dL   Creatinine, Ser 0.86 0.44 - 1.00 mg/dL   Calcium 8.9 8.9 - 10.3  mg/dL   GFR, Estimated >16 >10 mL/min    Comment: (NOTE) Calculated using the CKD-EPI Creatinine Equation (2021)    Anion gap 10 5 - 15    Comment: Performed at [facility], [address], [place], [place] [address]  CBC     Status: None   Collection Time: 08/19/22  4:00 PM  Result Value Ref Range   WBC 9.6 4.0 - 10.5 K/uL   RBC 4.69 3.87 - 5.11 MIL/uL   Hemoglobin 13.2 12.0 - 15.0 g/dL   HCT 40.9 81.1 - 91.4 %   MCV 87.6 80.0 - 100.0 fL   MCH 28.1 26.0 - 34.0 pg   MCHC 32.1 30.0 - 36.0 g/dL   RDW 78.2 95.6 - 21.3 %   Platelets 280 150 - 400  K/uL   nRBC 0.0 0.0 - 0.2 %    Comment: Performed at [facility], [address] [place], [place] [address]  Troponin I (High Sensitivity)     Status: None   Collection Time: 08/19/22  4:00 PM  Result Value Ref Range   Troponin I (High Sensitivity) 13 <18 ng/L    Comment: (NOTE) Elevated high sensitivity troponin I (hsTnI) values and significant  changes across serial measurements may suggest ACS but many other  chronic and acute conditions are known to elevate hsTnI results.  Refer to the "Links" section for chest pain algorithms and additional  guidance. Performed at [facility], [address] [place], [place] [address]   TSH     Status: None   Collection Time: 08/19/22  4:08 PM  Result Value Ref Range   TSH 1.543 0.350 - 4.500 uIU/mL    Comment: Performed by a 3rd Generation assay with a functional sensitivity of <=0.01 uIU/mL. Performed at [facility], [address], [place], [place] [address]   D-dimer, quantitative     Status: None   Collection Time: 08/19/22  4:08 PM  Result Value Ref Range   D-Dimer, Quant 0.48 0.00 - 0.50 ug/mL-FEU    Comment: (NOTE) At the manufacturer cut-off value of 0.5 g/mL FEU, this assay has a negative predictive value of 95-100%.This assay is intended for use in conjunction with a clinical pretest probability (PTP) assessment model to exclude pulmonary embolism (PE) and deep venous thrombosis (DVT) in outpatients suspected of PE or DVT. Results should be correlated with clinical presentation. Performed at [facility], [address] [place], [place] [address]   Brain natriuretic peptide     Status: Abnormal   Collection Time: 08/19/22  4:08 PM  Result Value Ref Range   B Natriuretic Peptide 209.2 (H) 0.0 - 100.0 pg/mL    Comment: Performed at [facility], [address] [place], [place] [address]  Resp panel by RT-PCR (RSV, Flu A&B, Covid) Anterior Nasal Swab     Status: None   Collection Time:  08/19/22  4:42 PM   Specimen: Anterior Nasal Swab  Result Value Ref Range   SARS Coronavirus 2 by RT PCR NEGATIVE NEGATIVE   Influenza A by PCR NEGATIVE NEGATIVE   Influenza B by PCR NEGATIVE NEGATIVE    Comment: (NOTE) The Xpert Xpress SARS-CoV-2/FLU/RSV plus assay is intended as an aid in the diagnosis of influenza from Nasopharyngeal swab specimens and should not be used as a sole basis for treatment. Nasal washings and aspirates are unacceptable for Xpert Xpress SARS-CoV-2/FLU/RSV testing.  Fact Sheet for Patients: BloggerCourse.com  Fact Sheet  for Healthcare Providers: SeriousBroker.it  This test is not yet approved or cleared by the [place] and has been authorized for detection and/or diagnosis of SARS-CoV-2 by FDA under an Emergency Use Authorization (EUA). This EUA will remain in effect (meaning this test can be used) for the duration of the COVID-19 declaration under Section 564(b)(1) of the Act, 21 U.S.C. section 360bbb-3(b)(1), unless the authorization is terminated or revoked.     Resp Syncytial Virus by PCR NEGATIVE NEGATIVE    Comment: (NOTE) Fact Sheet for Patients: BloggerCourse.com  Fact Sheet for Healthcare Providers: SeriousBroker.it  This test is not yet approved or cleared by the [place] FDA and has been authorized for detection and/or diagnosis of SARS-CoV-2 by FDA under an Emergency Use Authorization (EUA). This EUA will remain in effect (meaning this test can be used) for the duration of the COVID-19 declaration under Section 564(b)(1) of the Act, 21 U.S.C. section 360bbb-3(b)(1), unless the authorization is terminated or revoked.  Performed at [facility], [address] [place], [place] [address]    DG Chest 2 View  Result Date: 08/19/2022 CLINICAL DATA:  Shortness of breath. EXAM: CHEST - 2 VIEW COMPARISON:  June 25, 2022. FINDINGS: Stable cardiomediastinal silhouette. Both lungs are clear. The visualized skeletal structures are unremarkable. IMPRESSION: No active cardiopulmonary disease. Electronically Signed   By: Lupita Raider M.D.   On: 08/19/2022 15:47    Pending Labs Unresulted Labs (From admission, onward)     Start     Ordered   08/20/22 0630  Basic metabolic panel  [facility] Morning draw,   R      08/19/22 1912   08/20/22 0630  Magnesium  [facility] Morning draw,   R      08/19/22 1916            Vitals/Pain Today's Vitals   08/19/22 1645 08/19/22 1655 08/19/22 1748 08/19/22 1800  BP: 120/62 129/75 (!) 136/101 110/76  Pulse: (!) 118 (!) 109 (!) 119 (!) 120  Resp: (!) 23 (!) 24 15 19   Temp:      TempSrc:      SpO2: 97% 99% 99% 99%  Weight:      Height:      PainSc:        Isolation Precautions No active isolations  Medications Medications  diltiazem (CARDIZEM) 125 mg in dextrose 5% 125 mL (1 mg/mL) infusion (5 mg/hr Intravenous New Bag/Given 08/19/22 1652)  acetaminophen (TYLENOL) tablet 650 mg (has no administration in time range)  ondansetron (ZOFRAN) injection 4 mg (has no administration in time range)  apixaban (ELIQUIS) tablet 5 mg (has no administration in time range)  potassium chloride SA (KLOR-CON M) CR tablet 40 mEq (has no administration in time range)    Mobility walks     Focused Assessments Neuro Assessment Handoff:  Swallow screen pass? Yes  Cardiac Rhythm: Atrial fibrillation       Neuro Assessment:   Neuro Checks:      Has TPA been given? No If patient is a Neuro Trauma and patient is going to OR before floor call report to 4N Charge nurse: [phone] or [phone]   R Recommendations: See Admitting Provider Note  Report given to:   Additional Notes: pt is AAOx4. Pt is on room air.

## 2022-08-19 NOTE — Telephone Encounter (Signed)
Gave patient this info, she is going to head to ED now.

## 2022-08-19 NOTE — Telephone Encounter (Signed)
Patient calling to let you know she has been in A-fib all day. Her HR has ranged from 45-167 today. Denies Chest pain and SOB. She just feels exhausted. She wants to know what you recommend she do. She wants to let you know she has a coronary CT scan on Monday as well.   Please advise and I will call her back Call back number: [phone]

## 2022-08-19 NOTE — Telephone Encounter (Signed)
If she is that symptomatic w/ her Afib and her appt w/ EP is not until June 2024 its better she come to ED for sooner evaluation.   Dr. Odis Hollingshead

## 2022-08-19 NOTE — ED Provider Notes (Signed)
[place] EMERGENCY DEPARTMENT AT [facility] Provider Note   CSN: [id number] Arrival date & time: 08/19/22  1458     History  Chief Complaint  Patient presents with   Irregular Heart Beat    Kimberly Lamb is a 72 y.o. female.  The history is provided by the patient, a relative and medical records. No language interpreter was used.      Pt is a 72yo female presenting with an irregular heart rate. Pt has a PMHx of HTN, SVT, and afib. Pt felt her HR was elevated last night around midnight. Pt went to sleep without issue and awoke this morning no longer feeling as she did last night. Pt has an apple watch and monitored her HR throughout today. She went to work where she felt as though she was getting fatigued and SOB. Her apple watch reported an elevated HR that reached up to the 150's. Pt spoke with her daughter and cardiologist after work. They encouraged her to go to the ER for evaluation. Pt is on eliquis and has been taking her medications as directed. Pt denies any recent travel, surgery, and known sick contacts. Pt has been taking in plenty of fluids, especially water. Pt denies CP, HA, lightheadedness, dizziness, vision changes, abd pain, and N/V/D.   Home Medications Prior to Admission medications   Medication Sig Start Date End Date Taking? Authorizing Provider  apixaban (ELIQUIS) 5 MG TABS tablet Take 1 tablet (5 mg total) by mouth 2 (two) times daily. 05/29/22   Tolia, Sunit, DO  aspirin 81 MG EC tablet Take 81 mg by mouth daily. Swallow whole.    [provider]  atorvastatin (LIPITOR) 20 MG tablet Take 1 tablet (20 mg total) by mouth at bedtime. 05/02/20 07/06/22  Tolia, Sunit, DO  citalopram (CELEXA) 40 MG tablet Take 40 mg by mouth 2 (two) times daily as needed.    [provider]  cyclobenzaprine (FLEXERIL) 5 MG tablet Take 5 mg by mouth 3 (three) times daily as needed for muscle spasms.    [provider]  dicyclomine (BENTYL) 20 MG tablet  Take 20 mg by mouth 2 (two) times daily. 03/17/22   [provider]  diltiazem (CARDIZEM CD) 180 MG 24 hr capsule TAKE 1 CAPSULE BY MOUTH EVERY DAY 07/27/22   Tolia, Sunit, DO  flecainide (TAMBOCOR) 50 MG tablet TAKE 1 TABLET BY MOUTH TWICE A DAY 08/19/22   Tolia, Sunit, DO  furosemide (LASIX) 20 MG tablet Take 0.5 tablets (10 mg total) by mouth in the morning. Patient not taking: Reported on 07/06/2022 05/02/20 05/07/22  Odis Hollingshead, Sunit, DO  gabapentin (NEURONTIN) 100 MG capsule Take 100 mg by mouth 2 (two) times daily.    [provider]  hydrochlorothiazide (HYDRODIURIL) 25 MG tablet Take 1 tablet (25 mg total) by mouth in the morning. 05/02/20 07/06/22  Tolia, Sunit, DO  lisinopril (ZESTRIL) 40 MG tablet Take 1 tablet (40 mg total) by mouth daily. 05/02/20 07/06/22  Tolia, Sunit, DO  meloxicam (MOBIC) 15 MG tablet Take 15 mg by mouth daily.    [provider]  nitroGLYCERIN (NITROSTAT) 0.4 MG SL tablet Place 1 tablet (0.4 mg total) under the tongue every 5 (five) minutes as needed for chest pain. 08/16/19 07/06/22  Tolia, Sunit, DO  pantoprazole (PROTONIX) 40 MG tablet Take 40 mg by mouth 2 (two) times daily.    [provider]  PARoxetine (PAXIL) 30 MG tablet Take 30 mg by mouth daily. 05/11/22   [provider]  promethazine (PHENERGAN) 25 MG suppository Place 25 mg rectally every 6 (six) hours as needed for nausea or vomiting.    [provider]  traMADol (ULTRAM) 50 MG tablet Take 50 mg by mouth every 6 (six) hours as needed for severe pain.    [provider]      Allergies    Patient has no known allergies.    Review of Systems   Review of Systems  All other systems reviewed and are negative.   Physical Exam Updated Vital Signs BP 106/76 (BP Location: Right Arm)   Pulse (!) 114   Temp 98.3 F (36.8 C) (Oral)   Resp 20   Ht 5' 6.25" (1.683 m)   Wt 104.3 kg   SpO2 96%   BMI 36.84 kg/m  Physical Exam Vitals and nursing note  reviewed.  Constitutional:      General: She is not in acute distress.    Appearance: She is well-developed.  HENT:     Head: Atraumatic.     Mouth/Throat:     Mouth: Mucous membranes are moist.  Eyes:     Conjunctiva/sclera: Conjunctivae normal.  Neck:     Vascular: No carotid bruit.  Cardiovascular:     Rate and Rhythm: Tachycardia present. Rhythm irregular.     Pulses: Normal pulses.     Heart sounds: Normal heart sounds.  Pulmonary:     Effort: Pulmonary effort is normal.  Abdominal:     Palpations: Abdomen is soft.     Tenderness: There is no abdominal tenderness.  Musculoskeletal:     Cervical back: Normal range of motion and neck supple.     Right lower leg: No edema.     Left lower leg: No edema.  Skin:    Findings: No rash.  Neurological:     Mental Status: She is alert and oriented to person, place, and time.  Psychiatric:        Mood and Affect: Mood normal.     ED Results / Procedures / Treatments   Labs (all labs ordered are listed, but only abnormal results are displayed) Labs Reviewed  BASIC METABOLIC PANEL - Abnormal; Notable for the following components:      Result Value   Glucose, Bld 109 (*)    BUN 24 (*)    All other components within normal limits  BRAIN NATRIURETIC PEPTIDE - Abnormal; Notable for the following components:   B Natriuretic Peptide 209.2 (*)    All other components within normal limits  RESP PANEL BY RT-PCR (RSV, FLU A&B, COVID)  RVPGX2  CBC  TSH  D-DIMER, QUANTITATIVE  TROPONIN I (HIGH SENSITIVITY)  TROPONIN I (HIGH SENSITIVITY)    EKG EKG Interpretation  Date/Time:  Wednesday Aug 19 2022 15:57:19 EDT Ventricular Rate:  116 PR Interval:    QRS Duration: 84 QT Interval:  345 QTC Calculation: 480 R Axis:   -35 Text Interpretation: Atrial flutter with predominant 2:1 AV block Left axis deviation Low voltage, precordial leads Anteroseptal infarct, old Confirmed by Kristine Royal [phone]) on 08/19/2022 4:03:21  PM  Radiology DG Chest 2 View  Result Date: 08/19/2022 CLINICAL DATA:  Shortness of breath. EXAM: CHEST - 2 VIEW COMPARISON:  June 25, 2022. FINDINGS: Stable cardiomediastinal silhouette. Both lungs are clear. The visualized skeletal structures are unremarkable. IMPRESSION: No active cardiopulmonary disease. Electronically Signed   By: Lupita Raider M.D.   On: 08/19/2022 15:47    Procedures .Critical Care  Performed by: Laveda Norman,  Greta Doom, PA-C Authorized by: Fayrene Helper, PA-C   Critical care provider statement:    Critical care time (minutes):  30   Critical care was time spent personally by me on the following activities:  Development of treatment plan with patient or surrogate, discussions with consultants, evaluation of patient's response to treatment, examination of patient, ordering and review of laboratory studies, ordering and review of radiographic studies, ordering and performing treatments and interventions, pulse oximetry, re-evaluation of patient's condition and review of old charts     Medications Ordered in ED Medications  diltiazem (CARDIZEM) 125 mg in dextrose 5% 125 mL (1 mg/mL) infusion (5 mg/hr Intravenous New Bag/Given 08/19/22 1652)    ED Course/ Medical Decision Making/ A&P                             Medical Decision Making Amount and/or Complexity of Data Reviewed Labs: ordered. ECG/medicine tests: ordered.  Risk Prescription drug management. Decision regarding hospitalization.   BP 123/72   Pulse (!) 53   Temp 98.3 F (36.8 C) (Oral)   Resp 19   Ht 5' 6.25" (1.683 m)   Wt 104.3 kg   SpO2 98%   BMI 36.84 kg/m   4:20 PM This is a 72 year old female significant history of atrial fibrillation, currently on Eliquis and diltiazem, presenting with concerns of heart palpitation.  Patient reports since yesterday she felt a bit more tired than usual, and feels a slight short of breath.  She also noticed on her watch her heart rate was fluctuating between low  and high.  She went to sleep and woke up this morning noticing the same thing, she reached out to her cardiologist and recommend patient to come to ER.  At this time patient denies any significant headache, cold symptoms, chest pain, abdominal pain, focal numbness or focal weakness.  No trouble swallowing.  She has been very compliant with her medication and not missing any dose of Eliquis.  She denies any increased caffeine use or energy use.  She does not have any history of thyroid problems.  Denies any recent sickness.  States she is well-hydrated.  Denies any other medication changes.  Daughter noticed that her left leg is a bit more swollen today while she was laying in the bed.  Patient states this is not unusual for her.  She denies any leg pain or calf tenderness.  On exam this is a well-appearing elderly female resting comfortably in the bed appears to be in no acute discomfort.  No evidence of thyromegaly, heart with regular rate and rhythm rhythm, lungs clear to auscultation bilaterally abdomen is soft nontender, no significant peripheral edema appreciated.  She has intact distal pedal pulses.  Vital signs notable for heart rate in the 110-120s with EKG showing evidence of atrial fibrillation.  EMR reviewed, patient is on multiple medications for her atrial fibrillation including diltiazem for rate control, flecainide for rhythm control, and Eliquis for thromboembolic coverage.    I appreciate consultation from cardiologist, Dr. Odis Hollingshead who requested for additional labs including D-dimer, TSH, BNP to be obtained.  To hold flecainide and to start patient on a Cardizem drip at 5 mg and titrated keeping blood pressure above 100.  Will see patient and anticipate hospital admission to his service.  -Labs ordered, independently viewed and interpreted by me.  Labs remarkable for normal D-dimer, TSH and BNP is currently pending, normal troponin, electrolyte panels are reassuring, normal WBC,  normal  H&H. -The patient was maintained on a cardiac monitor.  I personally viewed and interpreted the cardiac monitored which showed an underlying rhythm of: A-fib with RVR -Imaging independently viewed and interpreted by me and I agree with radiologist's interpretation.  Result remarkable for chest x-ray without acute changes -This patient presents to the ED for concern of heart palpitation, this involves an extensive number of treatment options, and is a complaint that carries with it a high risk of complications and morbidity.  The differential diagnosis includes atrial fibrillation, atrial flutter, SVT, sinus tachycardia, thyroiditis, PE, CHF, MI, anemia, cardiac arrhythmia -Co morbidities that complicate the patient evaluation includes Hx afib, HTN, HLD -Treatment includes diltiazem -Reevaluation of the patient after these medicines showed that the patient improved -PCP office notes or outside notes reviewed -Discussion with specialist cardiologist DR. Tolia who agrees to see and admit pt -Escalation to admission/observation considered: patient is agreeable with admission          Final Clinical Impression(s) / ED Diagnoses Final diagnoses:  Atrial fibrillation with RVR [facility])    Rx / DC Orders ED Discharge Orders     None         Fayrene Helper, PA-C 08/19/22 1824    Wynetta Fines, MD 08/20/22 [phone]

## 2022-08-19 NOTE — H&P (Addendum)
HISTORY AND PHYSICAL  Patient ID: Kimberly Lamb MRN: [id number] DOB/AGE: [birth year] 72 y.o.  Admit date: 08/19/2022 Attending physician: Wynetta Fines, MD Primary Physician:  Vanessa Ladson, FNP  Chief complaint: Feeling tired and rapid heart rate  HPI:  Kimberly Lamb is a 72 y.o. female who presents with a chief complaint of " feeling tired and rapid heart rate." Her past medical history and cardiovascular risk factors include: Paroxysmal atrial fibrillation, moderate coronary artery calcification, nonobstructive CAD, obstructive sleep apnea on CPAP, benign essential hypertension, obesity, due to excess calories, postmenopausal female, advanced age.  Patient has no history of atrial fibrillation and has undergone cardioversion and is currently on flecainide 100 mg p.o. twice daily.  Despite such intervention she does have frequent episodes of paroxysmal A-fib which causes her to feel tired fatigue, palpitations.   She called the office stating that her ventricular rate based on her iWatch is ranging between 45-167 bpm.  Clinically she feels fatigued and worn out.    She has had multiple office and ED visits for similar symptoms.  Due to concerns for A-fib with RVR despite being on antiarrhythmic medications patient was asked to come to the ED for further evaluation and management.  ALLERGIES: No Known Allergies  PAST MEDICAL HISTORY: Past Medical History:  Diagnosis Date   Anxiety    Coronary artery calcification    Heart murmur    Hyperlipidemia    Hypertension    Nonobstructive atherosclerosis of coronary artery     PAST SURGICAL HISTORY: Past Surgical History:  Procedure Laterality Date   ACHILLES TENDON SURGERY  2010   BUBBLE STUDY  04/22/2022   Procedure: BUBBLE STUDY;  Surgeon: Tessa Lerner, DO;  Location: [facility];  Service: Cardiovascular;;   CARDIOVERSION N/A 04/22/2022   Procedure: CARDIOVERSION;  Surgeon: Tessa Lerner, DO;  Location: [facility];  Service:  Cardiovascular;  Laterality: N/A;   knee replacement Right 2009   x3 2009, 2012, 2014   ROTATOR CUFF REPAIR  2011   Right   SPINAL FUSION     l3   TEE WITHOUT CARDIOVERSION N/A 04/22/2022   Procedure: TRANSESOPHAGEAL ECHOCARDIOGRAM (TEE);  Surgeon: Tessa Lerner, DO;  Location: [facility];  Service: Cardiovascular;  Laterality: N/A;   VAGINAL HYSTERECTOMY  1986    FAMILY HISTORY: The patient family history includes Heart attack in her father; Heart failure in her father.   SOCIAL HISTORY:  The patient  reports that she has never smoked. She has never used smokeless tobacco. She reports that she does not drink alcohol and does not use drugs.  MEDICATIONS: Current Outpatient Medications  Medication Instructions   apixaban (ELIQUIS) 5 mg, Oral, 2 times daily   aspirin EC 81 mg, Oral, Daily, Swallow whole.    atorvastatin (LIPITOR) 20 mg, Oral, Daily at bedtime   dicyclomine (BENTYL) 20 mg, 2 times daily   diltiazem (CARDIZEM CD) 180 MG 24 hr capsule Oral, Daily   flecainide (TAMBOCOR) 50 mg, Oral, 2 times daily   furosemide (LASIX) 10 mg, Oral, Every morning   hydrochlorothiazide (HYDRODIURIL) 25 mg, Oral, Every morning   lisinopril (ZESTRIL) 40 mg, Oral, Daily   meloxicam (MOBIC) 15 mg, Oral, Daily   nitroGLYCERIN (NITROSTAT) 0.4 mg, Sublingual, Every 5 min PRN   pantoprazole (PROTONIX) 40 mg, Oral, 2 times daily   PARoxetine (PAXIL) 30 mg, Oral, Daily     diltiazem (CARDIZEM) infusion 5 mg/hr (08/19/22 1652)    REVIEW OF SYSTEMS: Review of Systems  Constitutional: Positive for malaise/fatigue.  Cardiovascular:  Positive for leg swelling and palpitations. Negative for chest pain, claudication, dyspnea on exertion, irregular heartbeat, near-syncope, orthopnea, paroxysmal nocturnal dyspnea and syncope.  Respiratory:  Negative for shortness of breath.   Hematologic/Lymphatic: Negative for bleeding problem.  Musculoskeletal:  Negative for muscle cramps and myalgias.   Neurological:  Negative for dizziness and light-headedness.  All other systems reviewed and are negative.   PHYSICAL EXAM:    08/19/2022    5:48 PM 08/19/2022    4:55 PM 08/19/2022    4:45 PM  Vitals with BMI  Systolic 136 129 161  Diastolic 101 75 62  Pulse 119 096 118    No intake or output data in the 24 hours ending 08/19/22 1837  Net IO Since Admission: No IO data has been entered for this period [08/19/22 1837] Physical Exam  Constitutional: No distress.  Age appropriate, hemodynamically stable.   Neck: No JVD present.  Cardiovascular: S1 normal, S2 normal, intact distal pulses and normal pulses. An irregularly irregular rhythm present. Tachycardia present. Exam reveals no gallop, no S3 and no S4.  No murmur heard. Pulses:      Popliteal pulses are 2+ on the right side and 2+ on the left side.       Dorsalis pedis pulses are 2+ on the right side and 2+ on the left side.  Pulmonary/Chest: Effort normal and breath sounds normal. No stridor. She has no wheezes. She has no rales.  Abdominal: Soft. Bowel sounds are normal. She exhibits no distension. There is no abdominal tenderness.  Musculoskeletal:        General: No edema.     Cervical back: Neck supple.  Neurological: She is alert and oriented to person, place, and time. She has intact cranial nerves (2-12).  Skin: Skin is warm and moist.   RADIOLOGY: DG Chest 2 View  Result Date: 08/19/2022 CLINICAL DATA:  Shortness of breath. EXAM: CHEST - 2 VIEW COMPARISON:  June 25, 2022. FINDINGS: Stable cardiomediastinal silhouette. Both lungs are clear. The visualized skeletal structures are unremarkable. IMPRESSION: No active cardiopulmonary disease. Electronically Signed   By: Lupita Raider M.D.   On: 08/19/2022 15:47    LABORATORY DATA: Lab Results  Component Value Date   WBC 9.6 08/19/2022   HGB 13.2 08/19/2022   HCT 41.1 08/19/2022   MCV 87.6 08/19/2022   PLT 280 08/19/2022    Recent Labs  Lab 08/19/22 1600  NA 138   K 3.8  CL 106  CO2 22  BUN 24*  CREATININE 0.86  CALCIUM 8.9  GLUCOSE 109*    Lipid Panel  Lab Results  Component Value Date   CHOL 126 10/26/2019   HDL 50 10/26/2019   LDLCALC 63 10/26/2019   TRIG 60 10/26/2019    BNP (last 3 results) Recent Labs    08/19/22 1608  BNP 209.2*    HEMOGLOBIN A1C No results found for: "HGBA1C", "MPG"  Cardiac Panel (last 3 results) Recent Labs    08/19/22 1600  TROPONINIHS 13   TSH Recent Labs    08/19/22 1608  TSH 1.543      CARDIAC DATABASE: EKG: Aug 19, 2022: Atrial fibrillation, 135 bpm, poor R wave progression, consider old anterior infarct, without underlying injury pattern.  Echocardiogram: 04/22/2022  1. Left ventricular ejection fraction, by estimation, is 50 to 55%. The  left ventricle has low normal function.   2. Right ventricular systolic function is normal. The right ventricular  size is normal.   3. Left  atrial size was moderately dilated. No left atrial/left atrial  appendage thrombus was detected. The LAA emptying velocity was 33 cm/s.   4. The mitral valve is normal in structure. Mild mitral valve  regurgitation. No evidence of mitral stenosis.   5. Tricuspid valve regurgitation is moderate.   6. The aortic valve is tricuspid. Aortic valve regurgitation is not  visualized. No aortic stenosis is present.   7. Aortic Proximal ascending aorta within normal limits. There is mild  dilatation of the aortic root, measuring 39 mm. There is mild (Grade II)  layered plaque involving the descending aorta.   8. See images 59, 60, 97. Agitated saline contrast bubble study was  positive with shunting observed within 3-6 cardiac cycles suggestive of  interatrial shunt.   Stress Testing:  Eugenie Birks (Walking with mod Bruce)Tetrofosmin Stress Test  08/09/2019: Mild degree Moderate extent perfusion defect consistent with mild (reversible) ischemia located in the mid to distal lateral wall (Left Circumflex Artery region)  of left ventricle. Overall LV systolic function is normal without regional wall motion abnormalities. Stress LV EF: 75%. Low risk.   Coronary CTA: 08/24/2019: 1. Coronary calcium score of 248. This was 87th percentile for age and sex matched controls. 2. Normal coronary origin with right dominance.  3. Minimal, non-obstructive CAD (<25%).  4. Small PFO.  5. Moderate mitral annular calcification.  RECOMMENDATIONS: Minimal non-obstructive CAD (0-24%). Consider preventive therapy and risk factor modification.   Heart Catheterization: None   Cardioversion: 04/22/2022: TEE guided cardioversion: Converted to sinus bradycardia after 200 J x 1. 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  IMPRESSION & RECOMMENDATIONS: Vanya Carberry is a 71 y.o. Caucasian female whose past medical history and cardiovascular risk factors include: Paroxysmal atrial fibrillation, moderate coronary artery calcification, nonobstructive CAD, obstructive sleep apnea on CPAP, benign essential hypertension, obesity, due to excess calories, postmenopausal female, advanced age.  Impression: Atrial fibrillation with rapid ventricular rate-symptomatic Long-term antiarrhythmic medication. Long-term anticoagulation. History of cardioversion (electrical and chemical). Sleep apnea on CPAP. Coronary artery calcification. Patent foramen ovale. Benign essential hypertension. Hyperlipidemia. Obesity due to excess calories.  Recommendations: Symptomatic atrial fibrillation with rapid ventricular rate: Rate control: Diltiazem. Rhythm control: Flecainide. Thromboembolic prophylaxis: Eliquis Will hold flecainide 100 mg p.o. twice daily for now. Will start diltiazem drip for rate control strategy. Will consult electrophysiology for evaluation for symptomatic atrial fibrillation to help weigh in with regards to the role of Tikosyn versus outpatient atrial fibrillation ablation candidacy. Very compliant  and a functional 72 year old female with recurrent bouts of A-fib Already undergone cardioversion long with antiarrhythmics. TSH within normal limits. Hemoglobin remained stable. Does not endorse evidence of bleeding. Last dose of flecainide this morning 08/19/2022 Uses CPAP on a regular basis and has appointment with sleep medicine later next week for reevaluation. Was scheduled to see electrophysiology in June 2024.  Due to 2 cardioversions since January 2024 and her being symptomatic despite being on antiarrhythmics she was advised to come to the ED for sooner evaluation.  Long-term antiarrhythmic medication: Will hold flecainide 100 mg p.o. twice daily as noted above.  Long-term oral anticoagulation: Indication: Paroxysmal atrial fibrillation. CHA2DS2-VASc SCORE is 4 which correlates to 4.8% risk of stroke per year (age, HTN, aortic atherosclerosis).  Hemoglobin remained stable.  Sleep apnea: Patient encouraged to use her home CPAP device at night.  Coronary calcification: Continue statin therapy  Patent foramen ovale: Monitor for now.  Mixed hyperlipidemia: Currently on atorvastatin.  Does not endorse myalgias.  Consultants: Cardiac electrophysiology  Code Status: Full   Family Communication: Daughter   Disposition Plan: Home when stable   Time spent: 80 mins  Patient's questions and concerns were addressed to her satisfaction. She voices understanding of the instructions provided during this encounter.   This note was created using a voice recognition software as a result there may be grammatical errors inadvertently enclosed that do not reflect the nature of this encounter. Every attempt is made to correct such errors.  Delilah Shan [facility]  Pager: [phone] Office: [phone] 08/19/2022, 6:37 PM

## 2022-08-20 DIAGNOSIS — I4892 Unspecified atrial flutter: Secondary | ICD-10-CM | POA: Diagnosis present

## 2022-08-20 DIAGNOSIS — E785 Hyperlipidemia, unspecified: Secondary | ICD-10-CM | POA: Diagnosis present

## 2022-08-20 DIAGNOSIS — Z96651 Presence of right artificial knee joint: Secondary | ICD-10-CM | POA: Diagnosis present

## 2022-08-20 DIAGNOSIS — E6609 Other obesity due to excess calories: Secondary | ICD-10-CM | POA: Diagnosis present

## 2022-08-20 DIAGNOSIS — I2510 Atherosclerotic heart disease of native coronary artery without angina pectoris: Secondary | ICD-10-CM | POA: Diagnosis present

## 2022-08-20 DIAGNOSIS — Z7901 Long term (current) use of anticoagulants: Secondary | ICD-10-CM | POA: Diagnosis not present

## 2022-08-20 DIAGNOSIS — Z90710 Acquired absence of both cervix and uterus: Secondary | ICD-10-CM | POA: Diagnosis not present

## 2022-08-20 DIAGNOSIS — I480 Paroxysmal atrial fibrillation: Secondary | ICD-10-CM

## 2022-08-20 DIAGNOSIS — Z981 Arthrodesis status: Secondary | ICD-10-CM | POA: Diagnosis not present

## 2022-08-20 DIAGNOSIS — Z8249 Family history of ischemic heart disease and other diseases of the circulatory system: Secondary | ICD-10-CM | POA: Diagnosis not present

## 2022-08-20 DIAGNOSIS — Z791 Long term (current) use of non-steroidal anti-inflammatories (NSAID): Secondary | ICD-10-CM | POA: Diagnosis not present

## 2022-08-20 DIAGNOSIS — G4733 Obstructive sleep apnea (adult) (pediatric): Secondary | ICD-10-CM | POA: Diagnosis present

## 2022-08-20 DIAGNOSIS — Z79899 Other long term (current) drug therapy: Secondary | ICD-10-CM | POA: Diagnosis not present

## 2022-08-20 DIAGNOSIS — E782 Mixed hyperlipidemia: Secondary | ICD-10-CM | POA: Diagnosis present

## 2022-08-20 DIAGNOSIS — I10 Essential (primary) hypertension: Secondary | ICD-10-CM | POA: Diagnosis present

## 2022-08-20 DIAGNOSIS — I4891 Unspecified atrial fibrillation: Secondary | ICD-10-CM | POA: Diagnosis present

## 2022-08-20 DIAGNOSIS — Q2112 Patent foramen ovale: Secondary | ICD-10-CM | POA: Diagnosis not present

## 2022-08-20 DIAGNOSIS — Z6836 Body mass index (BMI) 36.0-36.9, adult: Secondary | ICD-10-CM | POA: Diagnosis not present

## 2022-08-20 DIAGNOSIS — F419 Anxiety disorder, unspecified: Secondary | ICD-10-CM | POA: Diagnosis present

## 2022-08-20 DIAGNOSIS — Z7982 Long term (current) use of aspirin: Secondary | ICD-10-CM | POA: Diagnosis not present

## 2022-08-20 DIAGNOSIS — Z1152 Encounter for screening for COVID-19: Secondary | ICD-10-CM | POA: Diagnosis not present

## 2022-08-20 DIAGNOSIS — I4819 Other persistent atrial fibrillation: Secondary | ICD-10-CM | POA: Diagnosis present

## 2022-08-20 DIAGNOSIS — Z9889 Other specified postprocedural states: Secondary | ICD-10-CM | POA: Diagnosis not present

## 2022-08-20 LAB — BASIC METABOLIC PANEL
Anion gap: 7 (ref 5–15)
BUN: 18 mg/dL (ref 8–23)
CO2: 23 mmol/L (ref 22–32)
Calcium: 8.5 mg/dL — ABNORMAL LOW (ref 8.9–10.3)
Chloride: 105 mmol/L (ref 98–111)
Creatinine, Ser: 0.78 mg/dL (ref 0.44–1.00)
GFR, Estimated: >60 mL/min (ref >60)
Glucose, Bld: 127 mg/dL — ABNORMAL HIGH (ref 70–99)
Potassium: 3.2 mmol/L — ABNORMAL LOW (ref 3.5–5.1)
Sodium: 135 mmol/L (ref 135–145)

## 2022-08-20 LAB — MAGNESIUM: Magnesium: 1.9 mg/dL (ref 1.7–2.4)

## 2022-08-20 MED ORDER — METOPROLOL SUCCINATE ER 25 MG PO TB24
25.0000 mg | ORAL_TABLET | Freq: Every day | ORAL | Status: DC
  Administered 2022-08-20 – 2022-08-22 (×3): 25 mg via ORAL
  Filled 2022-08-20 (×3): qty 1

## 2022-08-20 MED ORDER — POTASSIUM CHLORIDE CRYS ER 20 MEQ PO TBCR
40.0000 meq | EXTENDED_RELEASE_TABLET | Freq: Two times a day (BID) | ORAL | Status: DC
  Administered 2022-08-20 – 2022-08-21 (×4): 40 meq via ORAL
  Filled 2022-08-20 (×5): qty 2

## 2022-08-20 MED ORDER — AMIODARONE LOAD VIA INFUSION
150.0000 mg | Freq: Once | INTRAVENOUS | Status: AC
  Administered 2022-08-20: 150 mg via INTRAVENOUS
  Filled 2022-08-20: qty 83.34

## 2022-08-20 MED ORDER — AMIODARONE HCL IN DEXTROSE 360-4.14 MG/200ML-% IV SOLN
60.0000 mg/h | INTRAVENOUS | Status: AC
  Administered 2022-08-20 (×2): 60 mg/h via INTRAVENOUS
  Filled 2022-08-20: qty 200

## 2022-08-20 MED ORDER — AMIODARONE HCL IN DEXTROSE 360-4.14 MG/200ML-% IV SOLN
30.0000 mg/h | INTRAVENOUS | Status: DC
  Administered 2022-08-21 – 2022-08-22 (×3): 30 mg/h via INTRAVENOUS
  Filled 2022-08-20 (×3): qty 200

## 2022-08-20 NOTE — Progress Notes (Signed)
Subjective:  Patient seen and examined at bedside, resting comfortably. No events overnight. She is still in Afib RVR with rates in 100-120 range but when she gets up and does any activity it goes as high at the 150s and she is quite symptomatic. She denies chest pain, shortness of breath, diaphoresis, syncope.  Intake/Output from previous day:  I/O last 3 completed shifts: In: -  Out: 3 [Urine:3] No intake/output data recorded. Net IO Since Admission: -3 mL [08/20/22 1126]  Blood pressure (!) 114/53, pulse 75, temperature 98 F (36.7 C), temperature source Oral, resp. rate 18, height 5' 6.25" (1.683 m), weight 106.8 kg, SpO2 96 %. Physical Exam Vitals reviewed.  HENT:     Head: Normocephalic and atraumatic.  Cardiovascular:     Rate and Rhythm: Tachycardia present. Rhythm irregular.     Pulses: Normal pulses.     Heart sounds: Normal heart sounds. No murmur heard. Pulmonary:     Effort: Pulmonary effort is normal.     Breath sounds: Normal breath sounds.  Abdominal:     General: Bowel sounds are normal.  Musculoskeletal:     Right lower leg: No edema.     Left lower leg: No edema.  Skin:    General: Skin is warm and dry.  Neurological:     Mental Status: She is alert.     Lab Results: Lab Results  Component Value Date   NA 135 08/20/2022   K 3.2 (L) 08/20/2022   CO2 23 08/20/2022   GLUCOSE 127 (H) 08/20/2022   BUN 18 08/20/2022   CREATININE 0.78 08/20/2022   CALCIUM 8.5 (L) 08/20/2022   GFRNONAA >60 08/20/2022    BNP (last 3 results) Recent Labs    08/19/22 1608  BNP 209.2*    ProBNP (last 3 results) No results for input(s): "PROBNP" in the last 8760 hours.    Latest Ref Rng & Units 08/20/2022    1:17 AM 08/19/2022    4:00 PM 06/25/2022   12:56 PM  BMP  Glucose 70 - 99 mg/dL 161  096  045   BUN 8 - 23 mg/dL 18  24  16    Creatinine 0.44 - 1.00 mg/dL 4.09  8.11  9.14   Sodium 135 - 145 mmol/L 135  138  140   Potassium 3.5 - 5.1 mmol/L 3.2  3.8  3.8    Chloride 98 - 111 mmol/L 105  106  104   CO2 22 - 32 mmol/L 23  22  17    Calcium 8.9 - 10.3 mg/dL 8.5  8.9  9.4        No data to display            Latest Ref Rng & Units 08/19/2022    4:00 PM 06/25/2022   12:56 PM 04/22/2022    8:42 AM  CBC  WBC 4.0 - 10.5 K/uL 9.6  11.8    Hemoglobin 12.0 - 15.0 g/dL 78.2  95.6  21.3   Hematocrit 36.0 - 46.0 % 41.1  46.5  42.0   Platelets 150 - 400 K/uL 280  353     Lipid Panel     Component Value Date/Time   CHOL 126 10/26/2019 0820   TRIG 60 10/26/2019 0820   HDL 50 10/26/2019 0820   LDLCALC 63 10/26/2019 0820   Cardiac Panel (last 3 results) No results for input(s): "CKTOTAL", "CKMB", "TROPONINI", "RELINDX" in the last 72 hours.  HEMOGLOBIN A1C No results found for: "HGBA1C", "MPG" TSH Recent  Labs    08/19/22 1608  TSH 1.543   Imaging: Imaging results have been reviewed and DG Chest 2 View  Result Date: 08/19/2022 CLINICAL DATA:  Shortness of breath. EXAM: CHEST - 2 VIEW COMPARISON:  June 25, 2022. FINDINGS: Stable cardiomediastinal silhouette. Both lungs are clear. The visualized skeletal structures are unremarkable. IMPRESSION: No active cardiopulmonary disease. Electronically Signed   By: Lupita Raider M.D.   On: 08/19/2022 15:47    Cardiac Studies:  EKG: Aug 19, 2022: Atrial fibrillation, 135 bpm, poor R wave progression, consider old anterior infarct, without underlying injury pattern.   Echocardiogram: 04/22/2022  1. Left ventricular ejection fraction, by estimation, is 50 to 55%. The  left ventricle has low normal function.   2. Right ventricular systolic function is normal. The right ventricular  size is normal.   3. Left atrial size was moderately dilated. No left atrial/left atrial  appendage thrombus was detected. The LAA emptying velocity was 33 cm/s.   4. The mitral valve is normal in structure. Mild mitral valve  regurgitation. No evidence of mitral stenosis.   5. Tricuspid valve regurgitation is moderate.    6. The aortic valve is tricuspid. Aortic valve regurgitation is not  visualized. No aortic stenosis is present.   7. Aortic Proximal ascending aorta within normal limits. There is mild  dilatation of the aortic root, measuring 39 mm. There is mild (Grade II)  layered plaque involving the descending aorta.   8. See images 59, 60, 97. Agitated saline contrast bubble study was  positive with shunting observed within 3-6 cardiac cycles suggestive of  interatrial shunt.    Stress Testing:  Eugenie Birks (Walking with mod Bruce)Tetrofosmin Stress Test  08/09/2019: Mild degree Moderate extent perfusion defect consistent with mild (reversible) ischemia located in the mid to distal lateral wall (Left Circumflex Artery region) of left ventricle. Overall LV systolic function is normal without regional wall motion abnormalities. Stress LV EF: 75%. Low risk.   Coronary CTA: 08/24/2019: 1. Coronary calcium score of 248. This was 87th percentile for age and sex matched controls. 2. Normal coronary origin with right dominance.  3. Minimal, non-obstructive CAD (<25%).  4. Small PFO.  5. Moderate mitral annular calcification.  RECOMMENDATIONS: Minimal non-obstructive CAD (0-24%). Consider preventive therapy and risk factor modification.   Heart Catheterization: None   Cardioversion: 04/22/2022: TEE guided cardioversion: Converted to sinus bradycardia after 200 J x 1. 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  Recent Results (from the past 16109 hour(s))  ECHO TEE   Collection Time: 04/22/22  9:48 AM  Result Value   Single Plane A4C EF 50.6   Est EF 50 - 55%   Narrative      TRANSESOPHOGEAL ECHO REPORT       Patient Name:   Kimberly Lamb Date of Exam: 04/22/2022 Medical Rec #:  [id number]    Height:       66.0 in Accession #:    [id number]   Weight:       226.6 lb Date of Birth:  1950-09-08    BSA:          2.109 m Patient Age:    71 years     BP:           107/81  mmHg Patient Gender: F            HR:           117 bpm. Exam Location:  Inpatient  Procedure: 3D  Echo, Transesophageal Echo, Cardiac Doppler, Color Doppler and            Saline Contrast Bubble Study  Indications:     I48.91* Unspeicified atrial fibrillation   History:         Patient has no prior history of Echocardiogram examinations.                  CAD, Abnormal ECG, Arrythmias:Atrial Fibrillation,                  Signs/Symptoms:Murmur; Risk Factors:Hypertension.   Sonographer:     Sheralyn Boatman RDCS Referring Phys:  [id number] Tessa Lerner DO Diagnosing Phys: Tessa Lerner DO  PROCEDURE: After discussion of the risks and benefits of a TEE, an informed consent was obtained from the patient. The transesophogeal probe was passed without difficulty through the esophogus of the patient. Imaged were obtained with the patient in a  supine position. Sedation performed by different physician. The patient was monitored while under deep sedation. Anesthestetic sedation was provided intravenously by Anesthesiology: 438mg  of Propofol. Image quality was good. The patient's vital signs;  including heart rate, blood pressure, and oxygen saturation; remained stable throughout the procedure. The patient developed no complications during the procedure. A successful direct current cardioversion was performed at 200 joules with 1 attempt.   IMPRESSIONS    1. Left ventricular ejection fraction, by estimation, is 50 to 55%. The left ventricle has low normal function.  2. Right ventricular systolic function is normal. The right ventricular size is normal.  3. Left atrial size was moderately dilated. No left atrial/left atrial appendage thrombus was detected. The LAA emptying velocity was 33 cm/s.  4. The mitral valve is normal in structure. Mild mitral valve regurgitation. No evidence of mitral stenosis.  5. Tricuspid valve regurgitation is moderate.  6. The aortic valve is tricuspid. Aortic valve regurgitation  is not visualized. No aortic stenosis is present.  7. Aortic Proximal ascending aorta within normal limits. There is mild dilatation of the aortic root, measuring 39 mm. There is mild (Grade II) layered plaque involving the descending aorta.  8. See images 59, 60, 97. Agitated saline contrast bubble study was positive with shunting observed within 3-6 cardiac cycles suggestive of interatrial shunt.  FINDINGS  Left Ventricle: Left ventricular ejection fraction, by estimation, is 50 to 55%. The left ventricle has low normal function. The left ventricular internal cavity size was normal in size.  Right Ventricle: The right ventricular size is normal. No increase in right ventricular wall thickness. Right ventricular systolic function is normal.  Left Atrium: Left atrial size was moderately dilated. Spontaneous echo contrast was present in the left atrium and left atrial appendage. No left atrial/left atrial appendage thrombus was detected. The LAA emptying velocity was 33 cm/s.  Right Atrium: Right atrial size was normal in size.  Pericardium: Trivial pericardial effusion is present. The pericardial effusion is anterior to the right ventricle.  Mitral Valve: The mitral valve is normal in structure. Mild mitral valve regurgitation. No evidence of mitral valve stenosis.  Tricuspid Valve: The tricuspid valve is grossly normal. Tricuspid valve regurgitation is moderate . No evidence of tricuspid stenosis.  Aortic Valve: The aortic valve is tricuspid. Aortic valve regurgitation is not visualized. No aortic stenosis is present.  Pulmonic Valve: The pulmonic valve was normal in structure. Pulmonic valve regurgitation is not visualized. No evidence of pulmonic stenosis.  Aorta: Proximal ascending aorta within normal limits. There is mild dilatation of the aortic root,  measuring 39 mm. There is mild (Grade II) layered plaque involving the descending aorta.  IAS/Shunts: There is redundancy of the  interatrial septum. See images 59, 60, 97. Agitated saline contrast was given intravenously to evaluate for intracardiac shunting. Agitated saline contrast bubble study was positive with shunting observed within 3-6  cardiac cycles suggestive of interatrial shunt.  Additional Comments: Spectral Doppler performed.  LEFT VENTRICLE PLAX 2D LVOT diam:     2.30 cm LVOT Area:     4.15 cm   LV Volumes (MOD) LV vol d, MOD A4C: 70.3 ml LV vol s, MOD A4C: 34.7 ml LV SV MOD A4C:     70.3 ml    SHUNTS Systemic Diam: 2.30 cm  Sunit Tolia DO Electronically signed by Tessa Lerner DO Signature Date/Time: 05/03/2022/10:57:00 PM       Final     *Note: Due to a large number of results and/or encounters for the requested time period, some results have not been displayed. A complete set of results can be found in Results Review.    Scheduled Meds:  apixaban  5 mg Oral BID   atorvastatin  20 mg Oral QHS   lisinopril  40 mg Oral Daily   pantoprazole  40 mg Oral BID   PARoxetine  30 mg Oral Daily   potassium chloride  40 mEq Oral BID   Continuous Infusions:  diltiazem (CARDIZEM) infusion 7.5 mg/hr (08/20/22 1038)   PRN Meds:.acetaminophen, ondansetron (ZOFRAN) IV  Assessment & Plan:  Kimberly Lamb is a 72 y.o. female patient with past medical history and cardiovascular risk factors include: Paroxysmal atrial fibrillation, moderate coronary artery calcification, nonobstructive CAD, obstructive sleep apnea on CPAP, benign essential hypertension, obesity, due to excess calories, postmenopausal female, advanced age.   Assessment: Atrial fibrillation with rapid ventricular rate-symptomatic Long-term antiarrhythmic medication. Long-term anticoagulation. History of cardioversion (electrical and chemical). Sleep apnea on CPAP. Coronary artery calcification. Patent foramen ovale. Benign essential hypertension. Hyperlipidemia. Obesity due to excess calories.   Recommendations: Symptomatic  atrial fibrillation with rapid ventricular rate: Rate control: Diltiazem. Rhythm control: Flecainide. Last dose morning of 08/19/2022 Thromboembolic prophylaxis: Eliquis Will hold flecainide 100 mg p.o. twice daily for now. Will start diltiazem drip for rate control strategy. Will consult electrophysiology for evaluation for symptomatic atrial fibrillation to help weigh in with regards to the role of Tikosyn versus outpatient atrial fibrillation ablation candidacy and rate control until ablation can be scheduled. Very compliant and a functional 72 year old female with recurrent bouts of A-fib Already undergone cardioversion long with antiarrhythmics. TSH within normal limits. Uses CPAP on a regular basis and has appointment with sleep medicine later next week for reevaluation. Was scheduled to see electrophysiology in June 2024.  Due to 2 cardioversions since January 2024 and her being symptomatic despite being on antiarrhythmics she was advised to come to the ED for sooner evaluation.   Long-term antiarrhythmic medication: Will hold flecainide 100 mg p.o. twice daily as noted above.   Long-term oral anticoagulation: Indication: Paroxysmal atrial fibrillation. CHA2DS2-VASc SCORE is 4 which correlates to 4.8% risk of stroke per year (age, HTN, aortic atherosclerosis).  Hemoglobin remained stable.   Sleep apnea: Patient encouraged to use her home CPAP device at night.   Coronary calcification: Continue statin therapy   Patent foramen ovale: Monitor for now.   Mixed hyperlipidemia: Currently on atorvastatin.  Does not endorse myalgias.       Clotilde Dieter, DO 08/20/2022, 11:26 AM Office: [phone] Fax: [phone] Pager: [phone]

## 2022-08-20 NOTE — Plan of Care (Signed)
  Problem: Health Behavior/Discharge Planning: Goal: Ability to manage health-related needs will improve Outcome: Progressing   Problem: Clinical Measurements: Goal: Ability to maintain clinical measurements within normal limits will improve Outcome: Progressing Goal: Cardiovascular complication will be avoided Outcome: Progressing   Problem: Activity: Goal: Risk for activity intolerance will decrease Outcome: Progressing   

## 2022-08-20 NOTE — Consult Note (Signed)
Cardiology Consultation   Patient ID: Verenice Westrich MRN: [id number]; DOB: 11-Nov-1950  Admit date: 08/19/2022 Date of Consult: 08/20/2022  PCP:  Vanessa Nunn, FNP   [facility] Providers Cardiologist:  Dr. Odis Hollingshead   Patient Profile:   Kimberly Lamb is a 72 y.o. 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 Odis Hollingshead.  Cardioversion: 04/22/2022: TEE guided cardioversion: Converted to sinus bradycardia after 200 J x 1. 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  AFib/AAD hx AFib noted Nov 2023 Flecainide started 03/16/22 SVT noted 06/25/22  History of Present Illness:   Ms. Chatterjee out patient on flecainide (100mg  BID) with dilt 180 daily for her AFib, comes with fatigue and palpitations found in rapid AFib. She was admitted for further management, her home flecainide stopped (given failure to maintain SR) and placed on a diltiazem gtt.  Last flecainide dose was 08/19/22 0600 She was pending EP consult in June w/Dr. Lalla Brothers  EP is asked on board for further rhythm control recommendations.  LABS K+ 3.8 > 3.2 BUN/Creat 24/0.86 >> 0/78 Mag 1.9 BNP 209 HS Trop 13, 14 WBC 9.6 H/H 13/41 Plts 280 TSH 1.543  DIlt gtt currently 7.5/hr  She is confident that Tuesday she was in normal rhythm by symptoms and her watch data, got up about MN to urinate and noted her HR was elevated though quickly back to the 50's, yesterday AM she felt she was in AFib, went to work, but eventually just too tired/ fatigued to stay and came in. No CP No SOB  Feels weak, tired, lousy in AFib  Past Medical History:  Diagnosis Date   Anxiety    Coronary artery calcification    Heart murmur    Hyperlipidemia    Hypertension    Nonobstructive atherosclerosis of coronary artery     Past Surgical History:   Procedure Laterality Date   ACHILLES TENDON SURGERY  2010   BUBBLE STUDY  04/22/2022   Procedure: BUBBLE STUDY;  Surgeon: Tessa Lerner, DO;  Location: [facility];  Service: Cardiovascular;;   CARDIOVERSION N/A 04/22/2022   Procedure: CARDIOVERSION;  Surgeon: Tessa Lerner, DO;  Location: [facility];  Service: Cardiovascular;  Laterality: N/A;   knee replacement Right 2009   x3 2009, 2012, 2014   ROTATOR CUFF REPAIR  2011   Right   SPINAL FUSION     l3   TEE WITHOUT CARDIOVERSION N/A 04/22/2022   Procedure: TRANSESOPHAGEAL ECHOCARDIOGRAM (TEE);  Surgeon: Tessa Lerner, DO;  Location: [facility];  Service: Cardiovascular;  Laterality: N/A;   VAGINAL HYSTERECTOMY  1986     Home Medications:  Prior to Admission medications   Medication Sig Start Date End Date Taking? Authorizing Provider  apixaban (ELIQUIS) 5 MG TABS tablet Take 1 tablet (5 mg total) by mouth 2 (two) times daily. 05/29/22  Yes Tolia, Sunit, DO  aspirin 81 MG EC tablet Take 81 mg by mouth daily. Swallow whole.   Yes [provider]  atorvastatin (LIPITOR) 20 MG tablet Take 1 tablet (20 mg total) by mouth at bedtime. 05/02/20 08/19/22 Yes Tolia, Sunit, DO  diltiazem (CARDIZEM CD) 180 MG 24 hr capsule TAKE 1 CAPSULE BY MOUTH EVERY DAY 07/27/22  Yes Tolia, Sunit, DO  flecainide (TAMBOCOR) 50 MG tablet TAKE 1 TABLET BY MOUTH TWICE A DAY 08/19/22  Yes Tolia, Sunit, DO  hydrochlorothiazide (HYDRODIURIL) 25 MG tablet Take 1 tablet (25 mg total) by mouth in the morning. 05/02/20 08/19/22 Yes Tolia, Sunit, DO  lisinopril (ZESTRIL) 40 MG tablet Take 1 tablet (40 mg total) by mouth daily. 05/02/20 08/19/22 Yes Tolia, Sunit, DO  meloxicam (MOBIC) 15 MG tablet Take 15 mg by mouth daily.   Yes [provider]  pantoprazole (PROTONIX) 40 MG tablet Take 40 mg by mouth 2 (two) times daily.   Yes [provider]  PARoxetine (PAXIL) 30 MG tablet Take 30 mg by mouth daily. 05/11/22  Yes [provider]  dicyclomine (BENTYL)  20 MG tablet Take 20 mg by mouth 2 (two) times daily. Patient not taking: Reported on 08/19/2022 03/17/22   [provider]  furosemide (LASIX) 20 MG tablet Take 0.5 tablets (10 mg total) by mouth in the morning. Patient not taking: Reported on 07/06/2022 05/02/20 05/07/22  Odis Hollingshead, Sunit, DO  nitroGLYCERIN (NITROSTAT) 0.4 MG SL tablet Place 1 tablet (0.4 mg total) under the tongue every 5 (five) minutes as needed for chest pain. 08/16/19 07/06/22  Tessa Lerner, DO    Inpatient Medications: Scheduled Meds:  apixaban  5 mg Oral BID   atorvastatin  20 mg Oral QHS   lisinopril  40 mg Oral Daily   pantoprazole  40 mg Oral BID   PARoxetine  30 mg Oral Daily   potassium chloride  40 mEq Oral BID   Continuous Infusions:  diltiazem (CARDIZEM) infusion 7.5 mg/hr (08/20/22 1150)   PRN Meds: acetaminophen, ondansetron (ZOFRAN) IV  Allergies:   No Known Allergies  Social History:   Social History   Socioeconomic History   Marital status: Widowed    Spouse name: Not on file   Number of children: 2   Years of education: Not on file   Highest education level: Not on file  Occupational History   Not on file  Tobacco Use   Smoking status: Never   Smokeless tobacco: Never  Vaping Use   Vaping Use: Never used  Substance and Sexual Activity   Alcohol use: Never   Drug use: Never   Sexual activity: Not Currently  Other Topics Concern   Not on file  Social History Narrative   Not on file   Social Determinants of Health   Financial Resource Strain: Not on file  Food Insecurity: Not on file  Transportation Needs: Not on file  Physical Activity: Not on file  Stress: Not on file  Social Connections: Not on file  Intimate Partner Violence: Not on file    Family History:   Family History  Problem Relation Age of Onset   Heart attack Father    Heart failure Father      ROS:  Please see the history of present illness.  All other ROS reviewed and negative.     Physical  Exam/Data:   Vitals:   08/19/22 2100 08/20/22 0037 08/20/22 0330 08/20/22 0956  BP: (!) 120/58 118/77 121/70 (!) 114/53  Pulse: (!) 56 61 (!) 54 75  Resp: 20 20 20 18   Temp: 97.9 F (36.6 C) 98 F (36.7 C) 98 F (36.7 C)   TempSrc: Oral Oral Oral   SpO2: 97% 98% 92% 96%  Weight:   106.8 kg   Height:        Intake/Output Summary (Last 24 hours) at 08/20/2022 1304 Last data filed at 08/20/2022 1150 Gross per 24 hour  Intake 132.56 ml  Output 3 ml  Net  129.56 ml      08/20/2022    3:30 AM 08/19/2022    3:08 PM 07/06/2022    3:02 PM  Last 3 Weights  Weight (lbs) 235 lb 6.4 oz 230 lb 238 lb 12.8 oz  Weight (kg) 106.777 kg 104.327 kg 108.319 kg     Body mass index is 37.71 kg/m.  General:  Well nourished, well developed, in no acute distress HEENT: normal Neck: no JVD Vascular: No carotid bruits; Distal pulses 2+ bilaterally Cardiac:  irreg-irreg, no murmur, gallops or rubs Lungs: CTA b/l, no wheezing, rhonchi or rales  Abd: soft, nontender Ext: no edema Musculoskeletal:  No deformities Skin: warm and dry  Neuro: no gross focal motor abnormalities noted Psych:  Normal affect   EKG:  The EKG was personally reviewed and demonstrates:   AFib 135bpm, LAD AFib 116bpm, , LAD  OLD 07/06/22 SB 56bpm, PR , QRS , QTc (by my manual measurement QT 440-460/QTc 425-447ms) 06/25/22: SVT (looks short RP), 182bpm  Telemetry:  Telemetry was personally reviewed and demonstrates:    AFib 90's-110 mostly some periods of faster    Relevant CV Studies:  Echocardiogram: 04/22/2022  1. Left ventricular ejection fraction, by estimation, is 50 to 55%. The  left ventricle has low normal function.   2. Right ventricular systolic function is normal. The right ventricular  size is normal.   3. Left atrial size was moderately dilated. No left atrial/left atrial  appendage thrombus was detected. The LAA emptying velocity was 33 cm/s.   4. The mitral valve is normal in structure.  Mild mitral valve  regurgitation. No evidence of mitral stenosis.   5. Tricuspid valve regurgitation is moderate.   6. The aortic valve is tricuspid. Aortic valve regurgitation is not  visualized. No aortic stenosis is present.   7. Aortic Proximal ascending aorta within normal limits. There is mild  dilatation of the aortic root, measuring 39 mm. There is mild (Grade II)  layered plaque involving the descending aorta.   8. See images 59, 60, 97. Agitated saline contrast bubble study was  positive with shunting observed within 3-6 cardiac cycles suggestive of  interatrial shunt.    Stress Testing:  Eugenie Birks (Walking with mod Bruce)Tetrofosmin Stress Test  08/09/2019: Mild degree Moderate extent perfusion defect consistent with mild (reversible) ischemia located in the mid to distal lateral wall (Left Circumflex Artery region) of left ventricle. Overall LV systolic function is normal without regional wall motion abnormalities. Stress LV EF: 75%. Low risk.   Coronary CTA: 08/24/2019: 1. Coronary calcium score of 248. This was 87th percentile for age and sex matched controls. 2. Normal coronary origin with right dominance.  3. Minimal, non-obstructive CAD (<25%).  4. Small PFO.  5. Moderate mitral annular calcification.  RECOMMENDATIONS: Minimal non-obstructive CAD (0-24%). Consider preventive therapy and risk factor modification.    Laboratory Data:  High Sensitivity Troponin:   Recent Labs  Lab 08/19/22 1600 08/19/22 2108  TROPONINIHS 13 14     Chemistry Recent Labs  Lab 08/19/22 1600 08/20/22 0117  NA 138 135  K 3.8 3.2*  CL 106 105  CO2 22 23  GLUCOSE 109* 127*  BUN 24* 18  CREATININE 0.86 0.78  CALCIUM 8.9 8.5*  MG  --  1.9  GFRNONAA >60 >60  ANIONGAP 10 7    No results for input(s): "PROT", "ALBUMIN", "AST", "ALT", "ALKPHOS", "BILITOT" in the last 168 hours. Lipids No results for input(s): "CHOL", "TRIG", "HDL", "LABVLDL", "LDLCALC", "CHOLHDL" in the  last 168  hours.  Hematology Recent Labs  Lab 08/19/22 1600  WBC 9.6  RBC 4.69  HGB 13.2  HCT 41.1  MCV 87.6  MCH 28.1  MCHC 32.1  RDW 14.0  PLT 280   Thyroid  Recent Labs  Lab 08/19/22 1608  TSH 1.543    BNP Recent Labs  Lab 08/19/22 1608  BNP 209.2*    DDimer  Recent Labs  Lab 08/19/22 1608  DDIMER 0.48     Radiology/Studies:  DG Chest 2 View  Result Date: 08/19/2022 CLINICAL DATA:  Shortness of breath. EXAM: CHEST - 2 VIEW COMPARISON:  June 25, 2022. FINDINGS: Stable cardiomediastinal silhouette. Both lungs are clear. The visualized skeletal structures are unremarkable. IMPRESSION: No active cardiopulmonary disease. Electronically Signed   By: Lupita Raider M.D.   On: 08/19/2022 15:47     Assessment and Plan:   Paroxysmal AFib RVR CHA2DS2Vasc is 3, on Eliquis Symptomatic   Rate is better controlled though still gets 120's or so with activity Considered failure of flecainide at this juncture 1/2 life of flecainide is 20hrs (12-30), in d/w pharmacist, need at least 2 full half lives, at minim 40 hours, recommended 48hours prior to consideration of tikosyn  I think her most recent SR EKG QT is perhaps borderline, though by my manual measurement get QTc 425-427ms In AFib this admission, her QTc machine read would be too long, 471 and  SR rates she reports even prior to dilt/flecainide were typically in the 50's  LA described as moderately dilated by her echo in Jan, I don't see a measurement in the report I suspect she would be an acceptable ablation candidate, though time-wise is months down the road  She is definitely on board to discuss/consider ablation, +/- AAD pending her visit with Dr. Nelly Laurence   Risk Assessment/Risk Scores:    For questions or updates, please contact  [facility] Please consult [email or web address] for contact info under    Signed, Sheilah Pigeon, PA-C  08/20/2022 1:04 PM

## 2022-08-20 NOTE — Progress Notes (Addendum)
  Transition of Care [facility]) Screening Note   Patient Details  Name: Trystan Eads Date of Birth: [birth year]   [facility]) CM/SW Contact:    Leone Haven, RN Phone Number: 08/20/2022, 4:56 PM    Transition of Care Department [facility]) has reviewed patient ,presents with afib rvr, on amio drip.  Patient has insurance and PCP on file.  We will continue to monitor patient advancement through interdisciplinary progression rounds. If new patient transition needs arise, please place a TOC consult.

## 2022-08-21 LAB — HEPATIC FUNCTION PANEL
ALT: 23 U/L (ref 0–44)
AST: 20 U/L (ref 15–41)
Albumin: 3.8 g/dL (ref 3.5–5.0)
Alkaline Phosphatase: 77 U/L (ref 38–126)
Bilirubin, Direct: <0.1 mg/dL (ref 0.0–0.2)
Total Bilirubin: 0.4 mg/dL (ref 0.3–1.2)
Total Protein: 6.6 g/dL (ref 6.5–8.1)

## 2022-08-21 LAB — BASIC METABOLIC PANEL
Anion gap: 12 (ref 5–15)
BUN: 17 mg/dL (ref 8–23)
CO2: 20 mmol/L — ABNORMAL LOW (ref 22–32)
Calcium: 9 mg/dL (ref 8.9–10.3)
Chloride: 106 mmol/L (ref 98–111)
Creatinine, Ser: 0.78 mg/dL (ref 0.44–1.00)
GFR, Estimated: >60 mL/min (ref >60)
Glucose, Bld: 102 mg/dL — ABNORMAL HIGH (ref 70–99)
Potassium: 4 mmol/L (ref 3.5–5.1)
Sodium: 138 mmol/L (ref 135–145)

## 2022-08-21 LAB — MAGNESIUM: Magnesium: 2 mg/dL (ref 1.7–2.4)

## 2022-08-21 MED ORDER — METOPROLOL SUCCINATE ER 25 MG PO TB24
25.0000 mg | ORAL_TABLET | Freq: Once | ORAL | Status: AC
  Administered 2022-08-21: 25 mg via ORAL
  Filled 2022-08-21: qty 1

## 2022-08-21 MED ORDER — METOPROLOL SUCCINATE ER 50 MG PO TB24
50.0000 mg | ORAL_TABLET | Freq: Every day | ORAL | Status: DC
  Administered 2022-08-22: 50 mg via ORAL
  Filled 2022-08-21: qty 1

## 2022-08-21 NOTE — Progress Notes (Signed)
Subjective:  Patient seen and examined at bedside, resting comfortably. No events overnight. She is currently on amio gtt and rate controlled. She has one more IV bag left after this so anticipate d/c tomorrow morning. She denies chest pain, shortness of breath, diaphoresis, syncope.  Intake/Output from previous day:  I/O last 3 completed shifts: In: 1229.8 [P.O.:820; I.V.:409.8] Out: 728 [Urine:728] Total I/O In: 240 [P.O.:240] Out: 450 [Urine:450] Net IO Since Admission: 291.82 mL [08/21/22 1150]  Blood pressure (!) 154/91, pulse 68, temperature 98 F (36.7 C), temperature source Oral, resp. rate 18, height 5' 6.25" (1.683 m), weight 107.8 kg, SpO2 98 %. Physical Exam Vitals reviewed.  HENT:     Head: Normocephalic and atraumatic.  Cardiovascular:     Rate and Rhythm: Normal rate. Rhythm irregular.     Pulses: Normal pulses.     Heart sounds: Normal heart sounds. No murmur heard. Pulmonary:     Effort: Pulmonary effort is normal.     Breath sounds: Normal breath sounds.  Abdominal:     General: Bowel sounds are normal.  Musculoskeletal:     Right lower leg: No edema.     Left lower leg: No edema.  Skin:    General: Skin is warm and dry.  Neurological:     Mental Status: She is alert.     Lab Results: Lab Results  Component Value Date   NA 135 08/20/2022   K 3.2 (L) 08/20/2022   CO2 23 08/20/2022   GLUCOSE 127 (H) 08/20/2022   BUN 18 08/20/2022   CREATININE 0.78 08/20/2022   CALCIUM 8.5 (L) 08/20/2022   GFRNONAA >60 08/20/2022    BNP (last 3 results) Recent Labs    08/19/22 1608  BNP 209.2*     ProBNP (last 3 results) No results for input(s): "PROBNP" in the last 8760 hours.    Latest Ref Rng & Units 08/20/2022    1:17 AM 08/19/2022    4:00 PM 06/25/2022   12:56 PM  BMP  Glucose 70 - 99 mg/dL 161  096  045   BUN 8 - 23 mg/dL 18  24  16    Creatinine 0.44 - 1.00 mg/dL 4.09  8.11  9.14   Sodium 135 - 145 mmol/L 135  138  140   Potassium 3.5 - 5.1 mmol/L  3.2  3.8  3.8   Chloride 98 - 111 mmol/L 105  106  104   CO2 22 - 32 mmol/L 23  22  17    Calcium 8.9 - 10.3 mg/dL 8.5  8.9  9.4        No data to display             Latest Ref Rng & Units 08/19/2022    4:00 PM 06/25/2022   12:56 PM 04/22/2022    8:42 AM  CBC  WBC 4.0 - 10.5 K/uL 9.6  11.8    Hemoglobin 12.0 - 15.0 g/dL 78.2  95.6  21.3   Hematocrit 36.0 - 46.0 % 41.1  46.5  42.0   Platelets 150 - 400 K/uL 280  353     Lipid Panel     Component Value Date/Time   CHOL 126 10/26/2019 0820   TRIG 60 10/26/2019 0820   HDL 50 10/26/2019 0820   LDLCALC 63 10/26/2019 0820   Cardiac Panel (last 3 results) No results for input(s): "CKTOTAL", "CKMB", "TROPONINI", "RELINDX" in the last 72 hours.  HEMOGLOBIN A1C No results found for: "HGBA1C", "MPG" TSH Recent Labs  08/19/22 1608  TSH 1.543    Imaging: Imaging results have been reviewed and DG Chest 2 View  Result Date: 08/19/2022 CLINICAL DATA:  Shortness of breath. EXAM: CHEST - 2 VIEW COMPARISON:  June 25, 2022. FINDINGS: Stable cardiomediastinal silhouette. Both lungs are clear. The visualized skeletal structures are unremarkable. IMPRESSION: No active cardiopulmonary disease. Electronically Signed   By: Lupita Raider M.D.   On: 08/19/2022 15:47    Cardiac Studies:  EKG: Aug 19, 2022: Atrial fibrillation, 135 bpm, poor R wave progression, consider old anterior infarct, without underlying injury pattern.   Echocardiogram: 04/22/2022  1. Left ventricular ejection fraction, by estimation, is 50 to 55%. The  left ventricle has low normal function.   2. Right ventricular systolic function is normal. The right ventricular  size is normal.   3. Left atrial size was moderately dilated. No left atrial/left atrial  appendage thrombus was detected. The LAA emptying velocity was 33 cm/s.   4. The mitral valve is normal in structure. Mild mitral valve  regurgitation. No evidence of mitral stenosis.   5. Tricuspid valve  regurgitation is moderate.   6. The aortic valve is tricuspid. Aortic valve regurgitation is not  visualized. No aortic stenosis is present.   7. Aortic Proximal ascending aorta within normal limits. There is mild  dilatation of the aortic root, measuring 39 mm. There is mild (Grade II)  layered plaque involving the descending aorta.   8. See images 59, 60, 97. Agitated saline contrast bubble study was  positive with shunting observed within 3-6 cardiac cycles suggestive of  interatrial shunt.    Stress Testing:  Eugenie Birks (Walking with mod Bruce)Tetrofosmin Stress Test  08/09/2019: Mild degree Moderate extent perfusion defect consistent with mild (reversible) ischemia located in the mid to distal lateral wall (Left Circumflex Artery region) of left ventricle. Overall LV systolic function is normal without regional wall motion abnormalities. Stress LV EF: 75%. Low risk.   Coronary CTA: 08/24/2019: 1. Coronary calcium score of 248. This was 87th percentile for age and sex matched controls. 2. Normal coronary origin with right dominance.  3. Minimal, non-obstructive CAD (<25%).  4. Small PFO.  5. Moderate mitral annular calcification.  RECOMMENDATIONS: Minimal non-obstructive CAD (0-24%). Consider preventive therapy and risk factor modification.   Heart Catheterization: None   Cardioversion: 04/22/2022: TEE guided cardioversion: Converted to sinus bradycardia after 200 J x 1. 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  Recent Results (from the past 16109 hour(s))  ECHO TEE   Collection Time: 04/22/22  9:48 AM  Result Value   Single Plane A4C EF 50.6   Est EF 50 - 55%   Narrative      TRANSESOPHOGEAL ECHO REPORT       Patient Name:   Kimberly Lamb Date of Exam: 04/22/2022 Medical Rec #:  [id number]    Height:       66.0 in Accession #:    [id number]   Weight:       226.6 lb Date of Birth:  11/28/1950    BSA:          2.109 m Patient Age:    71 years      BP:           107/81 mmHg Patient Gender: F            HR:           117 bpm. Exam Location:  Inpatient  Procedure: 3D Echo, Transesophageal Echo,  Cardiac Doppler, Color Doppler and            Saline Contrast Bubble Study  Indications:     I48.91* Unspeicified atrial fibrillation   History:         Patient has no prior history of Echocardiogram examinations.                  CAD, Abnormal ECG, Arrythmias:Atrial Fibrillation,                  Signs/Symptoms:Murmur; Risk Factors:Hypertension.   Sonographer:     Sheralyn Boatman RDCS Referring Phys:  [id number] Tessa Lerner DO Diagnosing Phys: Tessa Lerner DO  PROCEDURE: After discussion of the risks and benefits of a TEE, an informed consent was obtained from the patient. The transesophogeal probe was passed without difficulty through the esophogus of the patient. Imaged were obtained with the patient in a  supine position. Sedation performed by different physician. The patient was monitored while under deep sedation. Anesthestetic sedation was provided intravenously by Anesthesiology: 438mg  of Propofol. Image quality was good. The patient's vital signs;  including heart rate, blood pressure, and oxygen saturation; remained stable throughout the procedure. The patient developed no complications during the procedure. A successful direct current cardioversion was performed at 200 joules with 1 attempt.   IMPRESSIONS    1. Left ventricular ejection fraction, by estimation, is 50 to 55%. The left ventricle has low normal function.  2. Right ventricular systolic function is normal. The right ventricular size is normal.  3. Left atrial size was moderately dilated. No left atrial/left atrial appendage thrombus was detected. The LAA emptying velocity was 33 cm/s.  4. The mitral valve is normal in structure. Mild mitral valve regurgitation. No evidence of mitral stenosis.  5. Tricuspid valve regurgitation is moderate.  6. The aortic valve is tricuspid. Aortic  valve regurgitation is not visualized. No aortic stenosis is present.  7. Aortic Proximal ascending aorta within normal limits. There is mild dilatation of the aortic root, measuring 39 mm. There is mild (Grade II) layered plaque involving the descending aorta.  8. See images 59, 60, 97. Agitated saline contrast bubble study was positive with shunting observed within 3-6 cardiac cycles suggestive of interatrial shunt.  FINDINGS  Left Ventricle: Left ventricular ejection fraction, by estimation, is 50 to 55%. The left ventricle has low normal function. The left ventricular internal cavity size was normal in size.  Right Ventricle: The right ventricular size is normal. No increase in right ventricular wall thickness. Right ventricular systolic function is normal.  Left Atrium: Left atrial size was moderately dilated. Spontaneous echo contrast was present in the left atrium and left atrial appendage. No left atrial/left atrial appendage thrombus was detected. The LAA emptying velocity was 33 cm/s.  Right Atrium: Right atrial size was normal in size.  Pericardium: Trivial pericardial effusion is present. The pericardial effusion is anterior to the right ventricle.  Mitral Valve: The mitral valve is normal in structure. Mild mitral valve regurgitation. No evidence of mitral valve stenosis.  Tricuspid Valve: The tricuspid valve is grossly normal. Tricuspid valve regurgitation is moderate . No evidence of tricuspid stenosis.  Aortic Valve: The aortic valve is tricuspid. Aortic valve regurgitation is not visualized. No aortic stenosis is present.  Pulmonic Valve: The pulmonic valve was normal in structure. Pulmonic valve regurgitation is not visualized. No evidence of pulmonic stenosis.  Aorta: Proximal ascending aorta within normal limits. There is mild dilatation of the aortic root, measuring 39 mm.  There is mild (Grade II) layered plaque involving the descending aorta.  IAS/Shunts: There is  redundancy of the interatrial septum. See images 59, 60, 97. Agitated saline contrast was given intravenously to evaluate for intracardiac shunting. Agitated saline contrast bubble study was positive with shunting observed within 3-6  cardiac cycles suggestive of interatrial shunt.  Additional Comments: Spectral Doppler performed.  LEFT VENTRICLE PLAX 2D LVOT diam:     2.30 cm LVOT Area:     4.15 cm   LV Volumes (MOD) LV vol d, MOD A4C: 70.3 ml LV vol s, MOD A4C: 34.7 ml LV SV MOD A4C:     70.3 ml    SHUNTS Systemic Diam: 2.30 cm  Sunit Tolia DO Electronically signed by Tessa Lerner DO Signature Date/Time: 05/03/2022/10:57:00 PM       Final     *Note: Due to a large number of results and/or encounters for the requested time period, some results have not been displayed. A complete set of results can be found in Results Review.    Scheduled Meds:  apixaban  5 mg Oral BID   atorvastatin  20 mg Oral QHS   lisinopril  40 mg Oral Daily   metoprolol succinate  25 mg Oral Daily   [START ON 08/22/2022] metoprolol succinate  50 mg Oral Daily   pantoprazole  40 mg Oral BID   PARoxetine  30 mg Oral Daily   potassium chloride  40 mEq Oral BID   Continuous Infusions:  amiodarone 30 mg/hr (08/21/22 0149)   PRN Meds:.acetaminophen, ondansetron (ZOFRAN) IV  Assessment & Plan:  Kimberly Lamb is a 72 y.o. female patient with past medical history and cardiovascular risk factors include: Paroxysmal atrial fibrillation, moderate coronary artery calcification, nonobstructive CAD, obstructive sleep apnea on CPAP, benign essential hypertension, obesity, due to excess calories, postmenopausal female, advanced age.   Assessment: Atrial fibrillation with rapid ventricular rate-symptomatic Long-term antiarrhythmic medication. Long-term anticoagulation. History of cardioversion (electrical and chemical). Sleep apnea on CPAP. Coronary artery calcification. Patent foramen ovale. Benign essential  hypertension. Hyperlipidemia. Obesity due to excess calories.   Recommendations: Symptomatic atrial fibrillation with rapid ventricular rate: Rate control: metoprolol Rhythm control: amio gtt followed by PO amio per EP Thromboembolic prophylaxis: Eliquis EP on board, will continue metoprolol and amio with plan for ablation as outpatient. Anticipated d/c tomorrow morning.   Long-term antiarrhythmic medication: patient has failed flecainide.   Long-term oral anticoagulation: Indication: Paroxysmal atrial fibrillation. CHA2DS2-VASc SCORE is 4 which correlates to 4.8% risk of stroke per year (age, HTN, aortic atherosclerosis).  Hemoglobin remained stable.   Sleep apnea: Patient encouraged to use her home CPAP device at night.   Coronary calcification: Continue statin therapy   Patent foramen ovale: Monitor for now.   Mixed hyperlipidemia: Currently on atorvastatin.  Does not endorse myalgias.      Clotilde Dieter, DO 08/21/2022, 11:50 AM Office: [phone] Fax: [phone] Pager: [phone]

## 2022-08-21 NOTE — Progress Notes (Signed)
Not previously charted in my note Though the patient has excellent medication compliance particularly never missing doses of her Eliquis.  Francis Dowse, PA-C

## 2022-08-21 NOTE — Progress Notes (Signed)
Rounding Note    Patient Name: Kimberly Lamb Date of Encounter: 08/21/2022  [facility] Cardiologist: Dr. Odis Hollingshead  Subjective   Feeling much better  Inpatient Medications    Scheduled Meds:  apixaban  5 mg Oral BID   atorvastatin  20 mg Oral QHS   lisinopril  40 mg Oral Daily   metoprolol succinate  25 mg Oral Daily   pantoprazole  40 mg Oral BID   PARoxetine  30 mg Oral Daily   potassium chloride  40 mEq Oral BID   Continuous Infusions:  amiodarone 30 mg/hr (08/21/22 0149)   PRN Meds: acetaminophen, ondansetron (ZOFRAN) IV   Vital Signs    Vitals:   08/20/22 2017 08/21/22 0013 08/21/22 0417 08/21/22 0757  BP: (!) 130/96 102/89 (!) 108/96 (!) 154/91  Pulse: 83 79 75 68  Resp: 18 18 18 18   Temp: 97.8 F (36.6 C) 97.8 F (36.6 C) 97.8 F (36.6 C) 98 F (36.7 C)  TempSrc: Oral Oral Oral Oral  SpO2: 95% 95% 96% 98%  Weight:   107.8 kg   Height:        Intake/Output Summary (Last 24 hours) at 08/21/2022 1008 Last data filed at 08/21/2022 0955 Gross per 24 hour  Intake 1469.82 ml  Output 1175 ml  Net 294.82 ml      08/21/2022    4:17 AM 08/20/2022    3:30 AM 08/19/2022    3:08 PM  Last 3 Weights  Weight (lbs) 237 lb 9.6 oz 235 lb 6.4 oz 230 lb  Weight (kg) 107.775 kg 106.777 kg 104.327 kg      Telemetry    AFib 80's-110s - Personally Reviewed  ECG    No new EKGs - Personally Reviewed  Physical Exam   GEN: No acute distress.   Neck: No JVD Cardiac: irreg-irreg, no murmurs, rubs, or gallops.  Respiratory: CTA b/l. GI: Soft, nontender, non-distended  MS: No edema; No deformity. Neuro:  Nonfocal  Psych: Normal affect   Labs    High Sensitivity Troponin:   Recent Labs  Lab 08/19/22 1600 08/19/22 2108  TROPONINIHS 13 14     Chemistry Recent Labs  Lab 08/19/22 1600 08/20/22 0117  NA 138 135  K 3.8 3.2*  CL 106 105  CO2 22 23  GLUCOSE 109* 127*  BUN 24* 18  CREATININE 0.86 0.78  CALCIUM 8.9 8.5*  MG  --  1.9  GFRNONAA >60 >60   ANIONGAP 10 7    Lipids No results for input(s): "CHOL", "TRIG", "HDL", "LABVLDL", "LDLCALC", "CHOLHDL" in the last 168 hours.  Hematology Recent Labs  Lab 08/19/22 1600  WBC 9.6  RBC 4.69  HGB 13.2  HCT 41.1  MCV 87.6  MCH 28.1  MCHC 32.1  RDW 14.0  PLT 280   Thyroid  Recent Labs  Lab 08/19/22 1608  TSH 1.543    BNP Recent Labs  Lab 08/19/22 1608  BNP 209.2*    DDimer  Recent Labs  Lab 08/19/22 1608  DDIMER 0.48     Radiology    DG Chest 2 View  Result Date: 08/19/2022 CLINICAL DATA:  Shortness of breath. EXAM: CHEST - 2 VIEW COMPARISON:  June 25, 2022. FINDINGS: Stable cardiomediastinal silhouette. Both lungs are clear. The visualized skeletal structures are unremarkable. IMPRESSION: No active cardiopulmonary disease. Electronically Signed   By: Lupita Raider M.D.   On: 08/19/2022 15:47    Cardiac Studies   Echocardiogram: 04/22/2022  1. Left ventricular ejection fraction,  by estimation, is 50 to 55%. The  left ventricle has low normal function.   2. Right ventricular systolic function is normal. The right ventricular  size is normal.   3. Left atrial size was moderately dilated. No left atrial/left atrial  appendage thrombus was detected. The LAA emptying velocity was 33 cm/s.   4. The mitral valve is normal in structure. Mild mitral valve  regurgitation. No evidence of mitral stenosis.   5. Tricuspid valve regurgitation is moderate.   6. The aortic valve is tricuspid. Aortic valve regurgitation is not  visualized. No aortic stenosis is present.   7. Aortic Proximal ascending aorta within normal limits. There is mild  dilatation of the aortic root, measuring 39 mm. There is mild (Grade II)  layered plaque involving the descending aorta.   8. See images 59, 60, 97. Agitated saline contrast bubble study was  positive with shunting observed within 3-6 cardiac cycles suggestive of  interatrial shunt.    Stress Testing:  Eugenie Birks (Walking with mod  Bruce)Tetrofosmin Stress Test  08/09/2019: Mild degree Moderate extent perfusion defect consistent with mild (reversible) ischemia located in the mid to distal lateral wall (Left Circumflex Artery region) of left ventricle. Overall LV systolic function is normal without regional wall motion abnormalities. Stress LV EF: 75%. Low risk.   Coronary CTA: 08/24/2019: 1. Coronary calcium score of 248. This was 87th percentile for age and sex matched controls. 2. Normal coronary origin with right dominance.  3. Minimal, non-obstructive CAD (<25%).  4. Small PFO.  5. Moderate mitral annular calcification.  RECOMMENDATIONS: Minimal non-obstructive CAD (0-24%). Consider preventive therapy and risk factor modification.    Patient Profile     72 y.o. 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   AFib/AAD hx AFib noted Nov 2023 Flecainide started 03/16/22 SVT noted 06/25/22  Assessment & Plan    Paroxysmal AFib RVR CHA2DS2Vasc is 3, on Eliquis Symptomatic  HR is improving  Long term strategy will  be ablation  Plan to bridge to procedure with amiodarone short term Complete IV load then  > PO  LFTs ordered and appears to have been drawn but not yet resulted TSH baseline wnl  Increase metoprolol to 50mg  daily today for HR/BP  Plan will be to discharge once rate controlled or if she converts chemically in effort to avoid DCCV  Recommend f/u with Dr. Pearson Grippe cardiology team in about 2 weeks if not in SR then she would be more open to the idea of DCCV (having had suffered a torn rotator cuff at he last)  EP follow up will be arranged as well to look towards ablation timing/plans   For questions or updates, please contact [facility] Please consult [email or web address] for contact info under        Signed, Sheilah Pigeon, PA-C  08/21/2022, 10:08 AM

## 2022-08-22 DIAGNOSIS — I4891 Unspecified atrial fibrillation: Secondary | ICD-10-CM

## 2022-08-22 LAB — BASIC METABOLIC PANEL
Anion gap: 11 (ref 5–15)
BUN: 24 mg/dL — ABNORMAL HIGH (ref 8–23)
CO2: 22 mmol/L (ref 22–32)
Calcium: 8.8 mg/dL — ABNORMAL LOW (ref 8.9–10.3)
Chloride: 106 mmol/L (ref 98–111)
Creatinine, Ser: 0.86 mg/dL (ref 0.44–1.00)
GFR, Estimated: >60 mL/min (ref >60)
Glucose, Bld: 140 mg/dL — ABNORMAL HIGH (ref 70–99)
Potassium: 4.4 mmol/L (ref 3.5–5.1)
Sodium: 139 mmol/L (ref 135–145)

## 2022-08-22 LAB — MAGNESIUM: Magnesium: 2 mg/dL (ref 1.7–2.4)

## 2022-08-22 MED ORDER — FUROSEMIDE 20 MG PO TABS
10.0000 mg | ORAL_TABLET | Freq: Every day | ORAL | 0 refills | Status: AC | PRN
Start: 2022-08-22 — End: ?

## 2022-08-22 MED ORDER — POTASSIUM CHLORIDE CRYS ER 20 MEQ PO TBCR: 20.0000 meq | EXTENDED_RELEASE_TABLET | Freq: Every day | ORAL | 0 refills | Status: DC

## 2022-08-22 MED ORDER — AMIODARONE HCL 200 MG PO TABS: 200.0000 mg | ORAL_TABLET | Freq: Every day | ORAL | 0 refills | Status: DC

## 2022-08-22 MED ORDER — AMIODARONE HCL 200 MG PO TABS: 200.0000 mg | ORAL_TABLET | Freq: Two times a day (BID) | ORAL | 0 refills | Status: DC

## 2022-08-22 MED ORDER — METOPROLOL SUCCINATE ER 50 MG PO TB24: 50.0000 mg | ORAL_TABLET | Freq: Every morning | ORAL | 0 refills | Status: DC

## 2022-08-22 NOTE — Discharge Summary (Addendum)
Physician Discharge Summary  Patient ID: Kimberly Lamb MRN: [id number] DOB/AGE: 09-21-1950 72 y.o.  Admit date: 08/19/2022 Discharge date: 08/22/2022  Primary Discharge Diagnosis: Persistent atrial fibrillation with controlled ventricular rate. Long-term antiarrhythmic medication. Long-term anticoagulation.  Secondary Discharge Diagnosis: Sleep apnea on CPAP. Coronary artery calcification. Patent foramen ovale. Benign essential hypertension. Hyperlipidemia. Obesity due to excess calories.  Hospital Course:   72 y.o. Caucasian female  whose past medical history and cardiovascular risk factors include: Paroxysmal atrial fibrillation, moderate coronary artery calcification, nonobstructive CAD, obstructive sleep apnea on CPAP, benign essential hypertension, obesity, due to excess calories, postmenopausal female, advanced age.   Patient has known history of atrial fibrillation and has undergone cardioversion along with being on antiarrhythmic medications.  However despite such intervention she continues to be symptomatic and she appreciates when she goes in and out of atrial fibrillation.  Prior to coming to the ED patient was feeling tired fatigued, worn out and the concern that she may be in A-fib with RVR and she was advised to come to the ED for further evaluation and management.  Patient was found to be in A-fib with RVR on arrival more pronounced with exertional activities.  Discontinued flecainide with the hopes of either considering Tikosyn loading versus another antiarrhythmics to bridge her to possible atrial fibrillation ablation if she is an appropriate candidate.  She was scheduled to see electrophysiology in June 2024 but due to the current hospitalization she was evaluated inpatient.  The tentative plan is to schedule her for atrial fibrillation ablation in the near future and in the meantime was recommended to be on amiodarone for symptom control.  Patient was recommended to have  direct-current cardioversion prior to discharge; however, patient would like the medication first and if she does not spontaneously convert she is willing to discuss elective direct-current cardioversion as outpatient.  Patient has been loaded with IV amiodarone ventricular rates have improved at rest and also with effort related activities.  Patient is cleared from EP standpoint for discharge.   Discharge Exam: Temp:  [98 F (36.7 C)-98.1 F (36.7 C)] 98.1 F (36.7 C) (05/04 1154) Pulse Rate:  [81-90] 83 (05/04 1154) Cardiac Rhythm: Atrial fibrillation (05/04 0852) Resp:  [18] 18 (05/04 1154) BP: (102-135)/(66-96) 126/76 (05/04 1154) SpO2:  [94 %-99 %] 98 % (05/04 1154)  Today's Vitals   08/22/22 0018 08/22/22 0756 08/22/22 0850 08/22/22 1154  BP: 102/66 (!) 135/96  126/76  Pulse: 88 81  83  Resp: 18 18  18   Temp: 98 F (36.7 C) 98 F (36.7 C)  98.1 F (36.7 C)  TempSrc: Oral Oral  Oral  SpO2: 99% 95%  98%  Weight:      Height:      PainSc:   0-No pain    Body mass index is 38.06 kg/m.  Physical Exam  Constitutional: No distress.  Age appropriate, hemodynamically stable.   Neck: No JVD present.  Cardiovascular: Regular rhythm, S1 normal, S2 normal, intact distal pulses and normal pulses. Bradycardia present. Exam reveals no gallop, no S3 and no S4.  No murmur heard. Pulses:      Popliteal pulses are 2+ on the right side and 2+ on the left side.       Dorsalis pedis pulses are 2+ on the right side and 2+ on the left side.  Pulmonary/Chest: Effort normal and breath sounds normal. No stridor. She has no wheezes. She has no rales.  Abdominal: Soft. Bowel sounds are normal. She exhibits no distension. There  is no abdominal tenderness.  Musculoskeletal:        General: No edema.     Cervical back: Neck supple.  Neurological: She is alert and oriented to person, place, and time. She has intact cranial nerves (2-12).  Skin: Skin is warm and moist.   Recommendations on  discharge:   Amiodarone 200 mg p.o. twice daily x 7 days followed by amiodarone 200 mg p.o. daily medication profile for amiodarone discussed.  Patient states that she is aware as her mother used to be on this medication in the past. Continue oral anticoagulation for thromboembolic prophylaxis. Discontinue flecainide and diltiazem Start Toprol-XL 50 mg p.o. daily. Outpatient follow-up arranged to evaluate her underlying rhythm and discuss the possible need for direct-current cardioversion. Patient recommended to arrange outpatient follow-up with EP to further discuss possible ablation. Medications sent to her pharmacy in [place].  Patient and daughter's questions and concerns addressed to her satisfaction.  CARDIAC DATABASE: EKG: Aug 19, 2022: Atrial fibrillation, 135 bpm, poor R wave progression, consider old anterior infarct, without underlying injury pattern.   Echocardiogram: 04/22/2022  1. Left ventricular ejection fraction, by estimation, is 50 to 55%. The  left ventricle has low normal function.   2. Right ventricular systolic function is normal. The right ventricular  size is normal.   3. Left atrial size was moderately dilated. No left atrial/left atrial  appendage thrombus was detected. The LAA emptying velocity was 33 cm/s.   4. The mitral valve is normal in structure. Mild mitral valve  regurgitation. No evidence of mitral stenosis.   5. Tricuspid valve regurgitation is moderate.   6. The aortic valve is tricuspid. Aortic valve regurgitation is not  visualized. No aortic stenosis is present.   7. Aortic Proximal ascending aorta within normal limits. There is mild  dilatation of the aortic root, measuring 39 mm. There is mild (Grade II)  layered plaque involving the descending aorta.   8. See images 59, 60, 97. Agitated saline contrast bubble study was  positive with shunting observed within 3-6 cardiac cycles suggestive of  interatrial shunt.    Stress Testing:   Eugenie Birks (Walking with mod Bruce)Tetrofosmin Stress Test  08/09/2019: Mild degree Moderate extent perfusion defect consistent with mild (reversible) ischemia located in the mid to distal lateral wall (Left Circumflex Artery region) of left ventricle. Overall LV systolic function is normal without regional wall motion abnormalities. Stress LV EF: 75%. Low risk.   Coronary CTA: 08/24/2019: 1. Coronary calcium score of 248. This was 87th percentile for age and sex matched controls. 2. Normal coronary origin with right dominance.  3. Minimal, non-obstructive CAD (<25%).  4. Small PFO.  5. Moderate mitral annular calcification.  RECOMMENDATIONS: Minimal non-obstructive CAD (0-24%). Consider preventive therapy and risk factor modification.   Heart Catheterization: None   Cardioversion: 04/22/2022: TEE guided cardioversion: Converted to sinus bradycardia after 200 J x 1. 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  Labs:   Lab Results  Component Value Date   WBC 9.6 08/19/2022   HGB 13.2 08/19/2022   HCT 41.1 08/19/2022   MCV 87.6 08/19/2022   PLT 280 08/19/2022    Recent Labs  Lab 08/21/22 0952 08/22/22 0559  NA 138 139  K 4.0 4.4  CL 106 106  CO2 20* 22  BUN 17 24*  CREATININE 0.78 0.86  CALCIUM 9.0 8.8*  PROT 6.6  --   BILITOT 0.4  --   ALKPHOS 77  --   ALT 23  --  AST 20  --   GLUCOSE 102* 140*    Lipid Panel     Component Value Date/Time   CHOL 126 10/26/2019 0820   TRIG 60 10/26/2019 0820   HDL 50 10/26/2019 0820   LDLCALC 63 10/26/2019 0820    BNP (last 3 results) Recent Labs    08/19/22 1608  BNP 209.2*    HEMOGLOBIN A1C No results found for: "HGBA1C", "MPG"  Cardiac Panel (last 3 results) No results for input(s): "CKTOTAL", "CKMB", "TROPONINI", "RELINDX" in the last 8760 hours.  No results found for: "CKTOTAL", "CKMB", "CKMBINDEX", "TROPONINI"   TSH Recent Labs    08/19/22 1608  TSH 1.543    Radiology: DG  Chest 2 View  Result Date: 08/19/2022 CLINICAL DATA:  Shortness of breath. EXAM: CHEST - 2 VIEW COMPARISON:  June 25, 2022. FINDINGS: Stable cardiomediastinal silhouette. Both lungs are clear. The visualized skeletal structures are unremarkable. IMPRESSION: No active cardiopulmonary disease. Electronically Signed   By: Lupita Raider M.D.   On: 08/19/2022 15:47      FOLLOW UP PLANS AND APPOINTMENTS  Allergies as of 08/22/2022   No Known Allergies      Medication List     STOP taking these medications    aspirin EC 81 MG tablet   diltiazem 180 MG 24 hr capsule Commonly known as: CARDIZEM CD   flecainide 50 MG tablet Commonly known as: TAMBOCOR   meloxicam 15 MG tablet Commonly known as: MOBIC       TAKE these medications    amiodarone 200 MG tablet Commonly known as: Pacerone Take 1 tablet (200 mg total) by mouth 2 (two) times daily for 7 days.   amiodarone 200 MG tablet Commonly known as: Pacerone Take 1 tablet (200 mg total) by mouth daily for 90 doses. Start taking on: Aug 30, 2022   apixaban 5 MG Tabs tablet Commonly known as: Eliquis Take 1 tablet (5 mg total) by mouth 2 (two) times daily.   atorvastatin 20 MG tablet Commonly known as: LIPITOR Take 1 tablet (20 mg total) by mouth at bedtime.   dicyclomine 20 MG tablet Commonly known as: BENTYL Take 20 mg by mouth 2 (two) times daily.   furosemide 20 MG tablet Commonly known as: Lasix Take 0.5 tablets (10 mg total) by mouth daily as needed (leg swelling). What changed:  when to take this reasons to take this   hydrochlorothiazide 25 MG tablet Commonly known as: HYDRODIURIL Take 1 tablet (25 mg total) by mouth in the morning.   lisinopril 40 MG tablet Commonly known as: ZESTRIL Take 1 tablet (40 mg total) by mouth daily.   metoprolol succinate 50 MG 24 hr tablet Commonly known as: TOPROL-XL Take 1 tablet (50 mg total) by mouth every morning. Take with or immediately following a meal.    nitroGLYCERIN 0.4 MG SL tablet Commonly known as: Nitrostat Place 1 tablet (0.4 mg total) under the tongue every 5 (five) minutes as needed for chest pain.   pantoprazole 40 MG tablet Commonly known as: PROTONIX Take 40 mg by mouth 2 (two) times daily.   PARoxetine 30 MG tablet Commonly known as: PAXIL Take 30 mg by mouth daily.   potassium chloride SA 20 MEQ tablet Commonly known as: KLOR-CON M Take 1 tablet (20 mEq total) by mouth daily.        Follow-up Information     Jaymian Bogart, DO. Go on 08/31/2022.   Specialties: Cardiology, Vascular Surgery Why: 330pm  Post - hospitalization  AFib RVR, EKG on arrival, discuss need for cardioversion Contact information: [address] [place] [address] [phone]         Mealor, Roberts Gaudy, MD. Go on 09/21/2022.   Specialty: Cardiology Why: Discuss Afib Ablation Contact information: [address] [place] [address] [phone]                  Discharge time: 37 minutes.  Tessa Lerner, [place], [facility]  Pager:  [phone] Office: [phone]

## 2022-08-22 NOTE — Progress Notes (Signed)
   Rounding Note    Patient Name: Kimberly Lamb Date of Encounter: 08/22/2022  [facility] Cardiologist: None   Subjective   No acute events overnight.  Feeling well this morning.  Vital Signs    Vitals:   08/21/22 1929 08/22/22 0018 08/22/22 0756 08/22/22 1154  BP: 123/76 102/66 (!) 135/96 126/76  Pulse: 90 88 81 83  Resp: 18 18 18 18   Temp: 98 F (36.7 C) 98 F (36.7 C) 98 F (36.7 C) 98.1 F (36.7 C)  TempSrc: Oral Oral Oral Oral  SpO2: 94% 99% 95% 98%  Weight:      Height:        Intake/Output Summary (Last 24 hours) at 08/22/2022 1227 Last data filed at 08/21/2022 2341 Gross per 24 hour  Intake 720 ml  Output 250 ml  Net 470 ml      08/21/2022    4:17 AM 08/20/2022    3:30 AM 08/19/2022    3:08 PM  Last 3 Weights  Weight (lbs) 237 lb 9.6 oz 235 lb 6.4 oz 230 lb  Weight (kg) 107.775 kg 106.777 kg 104.327 kg      Telemetry    Heart rates in the 90s- Personally Reviewed  ECG    Personally Reviewed  Physical Exam    GEN: No acute distress.   Cardiac: Irregularly irregular, no murmurs, rubs, or gallops.  Respiratory: Clear to auscultation bilaterally. Psych: Normal affect   Assessment & Plan    #Persistent atrial fibrillation Continue amiodarone.  Okay to transition to oral.  Plan for this to be a bridge to catheter ablation.  Plan for outpatient follow-up in hopes of avoiding cardioversion given prior rotator cuff tear during cardioversion. Continue Eliquis.  Discussed with Dr. Raynaldo Opitz.  Okay to discharge.     Sheria Lang T. Lalla Brothers, MD, [facility], [facility] Cardiac Electrophysiology

## 2022-08-24 ENCOUNTER — Telehealth: Payer: Self-pay

## 2022-08-24 NOTE — Telephone Encounter (Signed)
Location of hospitalization: [place] [facility] Reason for hospitalization: SOB and heavy on chest Date of discharge: 08/22/2022 Date of first communication with patient: today Person contacting patient: Me Current symptoms: None Do you understand why you were in the Hospital: Yes Questions regarding discharge instructions: None Where were you discharged to: Home Medications reviewed: Yes Allergies reviewed: Yes Dietary changes reviewed: Yes. Discussed low fat and low salt diet.  Referals reviewed: NA Activities of Daily Living: Able to with mild limitations Any transportation issues/concerns: None Any patient concerns: None Confirmed importance & date/time of Follow up appt: Yes Confirmed with patient if condition begins to worsen call. Pt was given the office number and encouraged to call back with questions or concerns: Yes

## 2022-08-31 ENCOUNTER — Ambulatory Visit: Payer: Medicare Other | Admitting: Cardiology

## 2022-08-31 ENCOUNTER — Encounter: Payer: Self-pay | Admitting: Cardiology

## 2022-08-31 VITALS — BP 124/68 | HR 60 | Resp 18 | Ht 66.25 in | Wt 233.0 lb

## 2022-08-31 DIAGNOSIS — I480 Paroxysmal atrial fibrillation: Secondary | ICD-10-CM

## 2022-08-31 DIAGNOSIS — I2510 Atherosclerotic heart disease of native coronary artery without angina pectoris: Secondary | ICD-10-CM

## 2022-08-31 DIAGNOSIS — G4730 Sleep apnea, unspecified: Secondary | ICD-10-CM

## 2022-08-31 DIAGNOSIS — I10 Essential (primary) hypertension: Secondary | ICD-10-CM

## 2022-08-31 DIAGNOSIS — E782 Mixed hyperlipidemia: Secondary | ICD-10-CM

## 2022-08-31 DIAGNOSIS — Z79899 Other long term (current) drug therapy: Secondary | ICD-10-CM

## 2022-08-31 DIAGNOSIS — Z7901 Long term (current) use of anticoagulants: Secondary | ICD-10-CM

## 2022-08-31 DIAGNOSIS — Q2112 Patent foramen ovale: Secondary | ICD-10-CM

## 2022-08-31 MED ORDER — METOPROLOL SUCCINATE ER 25 MG PO TB24
25.0000 mg | ORAL_TABLET | Freq: Every morning | ORAL | 0 refills | Status: DC
Start: 2022-08-31 — End: 2023-02-26

## 2022-08-31 NOTE — Progress Notes (Signed)
Kimberly Lamb Date of Birth: 22-Oct-1950 MRN: [id number] Primary Care Provider:Stanfield, Bryson Ha, FNP Primary Cardiologist: Tessa Lerner, DO (established care 06/30/2019)  Date: 08/31/22 Last Office Visit: 07/06/2022  Chief Complaint  Patient presents with   Atrial Fibrillation   Transitions Of Care   HPI  Kimberly Lamb is a 72 y.o. female whose past medical history and cardiac risk factors include: Paroxysmal atrial fibrillation, moderate coronary artery calcification, nonobstructive CAD, obstructive sleep apnea on CPAP, benign essential hypertension, obesity, due to excess calories, postmenopausal female, advanced age.  Patient is being followed by the practice for paroxysmal atrial fibrillation, coronary artery calcification, minimal nonobstructive CAD.  Patient was referred persistent atrial fibrillation despite being on antiarrhythmic medications/AV nodal blocking agents.  She underwent TEE guided cardioversion in January 2024.  Thereafter in April 2024 she presented to the ED with symptomatic tachycardia and EKG suggestive of AVNRT.  She was chemically cardioverted with adenosine to normal sinus rhythm.  Thereafter her flecainide dose was increased with the hopes of maintaining sinus rhythm but due to symptomatic episode of recurrent A-fib with RVR she presented to the ED for further evaluation.  She was evaluated by electrophysiology during last hospitalization and the shared decision was to start amiodarone with consideration of ablation as outpatient.  Presents today for follow-up.  Has lost 4 pounds since last office visit.  She followed up with her sleep provider last week no plans on repeating sleep study, per patient.  She follows up with electrophysiology in June 2024 to discuss atrial fibrillation ablation.  She feels a bit more tired and fatigued compared to before but appreciates that she is in sinus rhythm.   ALLERGIES: No Known Allergies  MEDICATION LIST PRIOR TO  VISIT: Current Outpatient Medications on File Prior to Visit  Medication Sig Dispense Refill   amiodarone (PACERONE) 200 MG tablet Take 1 tablet (200 mg total) by mouth daily for 90 doses. 90 tablet 0   apixaban (ELIQUIS) 5 MG TABS tablet Take 1 tablet (5 mg total) by mouth 2 (two) times daily. 180 tablet 3   atorvastatin (LIPITOR) 20 MG tablet Take 1 tablet (20 mg total) by mouth at bedtime. 90 tablet 1   dicyclomine (BENTYL) 20 MG tablet Take 20 mg by mouth 2 (two) times daily.     furosemide (LASIX) 20 MG tablet Take 0.5 tablets (10 mg total) by mouth daily as needed (leg swelling). 45 tablet 0   hydrochlorothiazide (HYDRODIURIL) 25 MG tablet Take 1 tablet (25 mg total) by mouth in the morning. 90 tablet 1   lisinopril (ZESTRIL) 40 MG tablet Take 1 tablet (40 mg total) by mouth daily. 90 tablet 1   nitroGLYCERIN (NITROSTAT) 0.4 MG SL tablet Place 1 tablet (0.4 mg total) under the tongue every 5 (five) minutes as needed for chest pain. 90 tablet 3   pantoprazole (PROTONIX) 40 MG tablet Take 40 mg by mouth 2 (two) times daily.     PARoxetine (PAXIL) 30 MG tablet Take 30 mg by mouth daily.     potassium chloride SA (KLOR-CON M) 20 MEQ tablet Take 1 tablet (20 mEq total) by mouth daily. 30 tablet 0   No current facility-administered medications on file prior to visit.    PAST MEDICAL HISTORY: Past Medical History:  Diagnosis Date   Anxiety    Coronary artery calcification    Heart murmur    Hyperlipidemia    Hypertension    Nonobstructive atherosclerosis of coronary artery     PAST SURGICAL HISTORY: Past  Surgical History:  Procedure Laterality Date   ACHILLES TENDON SURGERY  2010   BUBBLE STUDY  04/22/2022   Procedure: BUBBLE STUDY;  Surgeon: Tessa Lerner, DO;  [facility];  Service: Cardiovascular;;   CARDIOVERSION N/A 04/22/2022   Procedure: CARDIOVERSION;  Surgeon: Tessa Lerner, DO;  Location: [facility];  Service: Cardiovascular;  Laterality: N/A;   knee replacement  Right 2009   x3 2009, 2012, 2014   ROTATOR CUFF REPAIR  2011   Right   SPINAL FUSION     l3   TEE WITHOUT CARDIOVERSION N/A 04/22/2022   Procedure: TRANSESOPHAGEAL ECHOCARDIOGRAM (TEE);  Surgeon: Tessa Lerner, DO;  Location: [facility];  Service: Cardiovascular;  Laterality: N/A;   VAGINAL HYSTERECTOMY  1986    FAMILY HISTORY: The patient family history includes Heart attack in her father; Heart failure in her father.   SOCIAL HISTORY:  The patient  reports that she has never smoked. She has never used smokeless tobacco. She reports that she does not drink alcohol and does not use drugs.  Review of Systems  Constitutional: Positive for malaise/fatigue and weight loss.  Cardiovascular:  Negative for chest pain, claudication, dyspnea on exertion, irregular heartbeat, leg swelling, near-syncope, orthopnea, palpitations, paroxysmal nocturnal dyspnea and syncope.  Respiratory:  Positive for snoring. Negative for shortness of breath.   Hematologic/Lymphatic: Negative for bleeding problem.  Musculoskeletal:  Negative for muscle cramps and myalgias.  Neurological:  Negative for dizziness and light-headedness.   PHYSICAL EXAM:    08/31/2022    3:20 PM 08/22/2022   11:54 AM 08/22/2022    7:56 AM  Vitals with BMI  Height 5' 6.25"    Weight 233 lbs    BMI 37.31    Systolic 124 126 220  Diastolic 68 76 96  Pulse 60 83 81   Physical Exam  Constitutional: No distress.  Age appropriate, hemodynamically stable.   Neck: No JVD present.  Cardiovascular: Regular rhythm, S1 normal, S2 normal, intact distal pulses and normal pulses. Bradycardia present. Exam reveals no gallop, no S3 and no S4.  No murmur heard. Pulses:      Popliteal pulses are 2+ on the right side and 2+ on the left side.       Dorsalis pedis pulses are 2+ on the right side and 2+ on the left side.  Pulmonary/Chest: Effort normal and breath sounds normal. No stridor. She has no wheezes. She has no rales.  Abdominal: Soft.  Bowel sounds are normal. She exhibits no distension. There is no abdominal tenderness.  Musculoskeletal:        General: No edema.     Cervical back: Neck supple.  Neurological: She is alert and oriented to person, place, and time. She has intact cranial nerves (2-12).  Skin: Skin is warm and moist.   CARDIAC DATABASE: EKG: Aug 31, 2022: Sinus bradycardia, 53 bpm, without underlying injury pattern.  Echocardiogram: 07/06/2019: LVEF 55-60%, moderate left ventricular hypertrophy, indeterminate diastolic filling pattern, mild to moderately dilated left atrium, mild MAC, mild MR, mild TR.  TEE 04/22/2022:  LVEF 50 to 55%, normal right ventricular function, Moderately dilated left atrium, mild MR, moderate TR, mild aortic root dilatation 39 mm, aortic atherosclerosis, positive bubble study due to PFO.  Stress Testing:  Eugenie Birks (Walking with mod Bruce)Tetrofosmin Stress Test  08/09/2019: Mild degree Moderate extent perfusion defect consistent with mild (reversible) ischemia located in the mid to distal lateral wall (Left Circumflex Artery region) of left ventricle. Overall LV systolic function is normal without regional  wall motion abnormalities. Stress LV EF: 75%. Low risk.  Coronary CTA: 08/24/2019: 1. Coronary calcium score of 248. This was 87th percentile for age and sex matched controls. 2. Normal coronary origin with right dominance.  3. Minimal, non-obstructive CAD (<25%).  4. Small PFO.  5. Moderate mitral annular calcification.  RECOMMENDATIONS: Minimal non-obstructive CAD (0-24%). Consider preventive therapy and risk factor modification.  Heart Catheterization: None  Cardioversion: 04/22/2022: TEE guided cardioversion: Converted to sinus bradycardia after 200 J x 1. 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   LABORATORY DATA: External Labs: Collected: 05/26/2021 provided by Dr. Argentina Ponder Hemoglobin 14.6 g/dL, hematocrit 16.1% BUN 24, creatinine  0.83. Sodium 140, potassium 4.4, chloride 108, bicarb 30 AST 11, ALT 20, alkaline phosphatase 72. Total cholesterol 145, triglycerides 135, HDL 52, LDL 67 TSH 1.02     Latest Ref Rng & Units 08/19/2022    4:00 PM 06/25/2022   12:56 PM 04/22/2022    8:42 AM  CBC  WBC 4.0 - 10.5 K/uL 9.6  11.8    Hemoglobin 12.0 - 15.0 g/dL 09.6  04.5  40.9   Hematocrit 36.0 - 46.0 % 41.1  46.5  42.0   Platelets 150 - 400 K/uL 280  353        Latest Ref Rng & Units 08/22/2022    5:59 AM 08/21/2022    9:52 AM 08/20/2022    1:17 AM  BMP  Glucose 70 - 99 mg/dL 811  914  782   BUN 8 - 23 mg/dL 24  17  18    Creatinine 0.44 - 1.00 mg/dL 9.56  2.13  0.86   Sodium 135 - 145 mmol/L 139  138  135   Potassium 3.5 - 5.1 mmol/L 4.4  4.0  3.2   Chloride 98 - 111 mmol/L 106  106  105   CO2 22 - 32 mmol/L 22  20  23    Calcium 8.9 - 10.3 mg/dL 8.8  9.0  8.5     FINAL MEDICATION LIST END OF ENCOUNTER: Meds ordered this encounter  Medications   metoprolol succinate (TOPROL-XL) 25 MG 24 hr tablet    Sig: Take 1 tablet (25 mg total) by mouth every morning. Take with or immediately following a meal.    Dispense:  30 tablet    Refill:  0    Medications Discontinued During This Encounter  Medication Reason   amiodarone (PACERONE) 200 MG tablet    metoprolol succinate (TOPROL-XL) 50 MG 24 hr tablet Reorder      Current Outpatient Medications:    amiodarone (PACERONE) 200 MG tablet, Take 1 tablet (200 mg total) by mouth daily for 90 doses., Disp: 90 tablet, Rfl: 0   apixaban (ELIQUIS) 5 MG TABS tablet, Take 1 tablet (5 mg total) by mouth 2 (two) times daily., Disp: 180 tablet, Rfl: 3   atorvastatin (LIPITOR) 20 MG tablet, Take 1 tablet (20 mg total) by mouth at bedtime., Disp: 90 tablet, Rfl: 1   dicyclomine (BENTYL) 20 MG tablet, Take 20 mg by mouth 2 (two) times daily., Disp: , Rfl:    furosemide (LASIX) 20 MG tablet, Take 0.5 tablets (10 mg total) by mouth daily as needed (leg swelling)., Disp: 45 tablet, Rfl: 0    hydrochlorothiazide (HYDRODIURIL) 25 MG tablet, Take 1 tablet (25 mg total) by mouth in the morning., Disp: 90 tablet, Rfl: 1   lisinopril (ZESTRIL) 40 MG tablet, Take 1 tablet (40 mg total) by mouth daily., Disp: 90 tablet,  Rfl: 1   nitroGLYCERIN (NITROSTAT) 0.4 MG SL tablet, Place 1 tablet (0.4 mg total) under the tongue every 5 (five) minutes as needed for chest pain., Disp: 90 tablet, Rfl: 3   pantoprazole (PROTONIX) 40 MG tablet, Take 40 mg by mouth 2 (two) times daily., Disp: , Rfl:    PARoxetine (PAXIL) 30 MG tablet, Take 30 mg by mouth daily., Disp: , Rfl:    potassium chloride SA (KLOR-CON M) 20 MEQ tablet, Take 1 tablet (20 mEq total) by mouth daily., Disp: 30 tablet, Rfl: 0   metoprolol succinate (TOPROL-XL) 25 MG 24 hr tablet, Take 1 tablet (25 mg total) by mouth every morning. Take with or immediately following a meal., Disp: 30 tablet, Rfl: 0  IMPRESSION:    ICD-10-CM   1. Paroxysmal atrial fibrillation (HCC)  I48.0 EKG 12-Lead    metoprolol succinate (TOPROL-XL) 25 MG 24 hr tablet    2. Long term (current) use of anticoagulants  Z79.01     3. Long term current use of antiarrhythmic drug  Z79.899 Pulmonary function test    4. Sleep apnea in adult  G47.30     5. Coronary artery calcification seen on computed tomography  I25.10     6. Nonobstructive atherosclerosis of coronary artery  I25.10     7. PFO (patent foramen ovale)  Q21.12     8. Benign hypertension  I10     9. Mixed hyperlipidemia  E78.2        RECOMMENDATIONS: Kimberly Lamb is a 72 y.o. female whose past medical history and cardiac risk factors include: Paroxysmal atrial fibrillation, moderate coronary artery calcification, nonobstructive CAD, obstructive sleep apnea on CPAP, benign essential hypertension, obesity, due to excess calories, postmenopausal female, advanced age.  Paroxysmal atrial fibrillation (HCC) Rate control: Metoprolol. Rhythm control: Flecainide. Thromboembolic prophylaxis:  Eliquis. CHA2DS2-VASc SCORE is 4 which correlates to 4.8 % risk of stroke per year (age, HTN, aortic atherosclerosis). Underwent TEE guided cardioversion 04/22/2022 200 J x 1. Reduce metoprolol to 25 mg p.o. daily secondary to bradycardia her feeling tired and fatigue Upcoming appointment with EP in June 2024 to discuss atrial fibrillation ablation. She did follow-up with sleep medicine last week and no plans on repeating sleep study, per patient  Long term (current) use of anticoagulants Indication: Paroxysmal atrial fibrillation Provided patient samples for Eliquis (3 weeks). Her patient assistance form is still being processed.   Long term current use of antiarrhythmic drug Indication: Paroxysmal atrial fibrillation. Failed flecainide. Discussed role of either Tikosyn versus amiodarone in the interim while she gets worked up for atrial fibrillation ablation.  EP recommended amiodarone after she was loaded during her recent hospitalization. Patient is aware that amiodarone usage requires monitoring of side effect profile. Check PFTs  Sleep apnea: Compliant with daily device usage. Did establish care with sleep provider since last office visit. Prior to this she did not have a sleep provider and [employer] was replacing her parts. From a cardiovascular standpoint she would benefit from repeat sleep study to reevaluate the burden of apnea.  Patient states that the frequency of her apneic episodes have been more prominent in the recent past. She would benefit from evaluation of nocturnal hypoxia and possible BiPAP as well.  Nonobstructive atherosclerosis of coronary artery Coronary artery calcification Continue  statin therapy. CADS-RAD 1 based on last CCTA.  Denies anginal discomfort. Reemphasized importance of secondary prevention.  Patent foramen ovale: Noted on coronary CTA as well as a TEE. Currently asymptomatic Monitor for now.  Benign hypertension Office blood pressures are  well-controlled. No changes warranted at this time  Mixed hyperlipidemia Currently on atorvastatin.   She denies myalgia or other side effects. Currently managed by primary care provider.   Orders Placed This Encounter  Procedures   EKG 12-Lead   Pulmonary function test   --Continue cardiac medications as reconciled in final medication list. --Return in about 3 months (around 12/01/2022) for Follow up, A. fib. Or sooner if needed. --Continue follow-up with your primary care physician regarding the management of your other chronic comorbid conditions.  Patient's questions and concerns were addressed to her satisfaction. She voices understanding of the instructions provided during this encounter.   This note was created using a voice recognition software as a result there may be grammatical errors inadvertently enclosed that do not reflect the nature of this encounter. Every attempt is made to correct such errors.  Tessa Lerner, [place], [facility]  Pager:  [phone] Office: [phone]

## 2022-09-02 ENCOUNTER — Other Ambulatory Visit: Payer: Self-pay

## 2022-09-02 DIAGNOSIS — I480 Paroxysmal atrial fibrillation: Secondary | ICD-10-CM

## 2022-09-03 ENCOUNTER — Telehealth: Payer: Self-pay

## 2022-09-03 NOTE — Telephone Encounter (Signed)
Spoke with daughter and informed her that I spoke with Alver Fisher and and was informed that 127.85 still needs to spent on medications to be approved for Eliquis. Patient daughter verbalized understanding.

## 2022-09-20 ENCOUNTER — Other Ambulatory Visit: Payer: Self-pay | Admitting: Cardiology

## 2022-09-21 ENCOUNTER — Ambulatory Visit: Payer: Medicare Other | Attending: Cardiology | Admitting: Cardiovascular Disease

## 2022-09-21 ENCOUNTER — Encounter: Payer: Self-pay | Admitting: Cardiovascular Disease

## 2022-09-21 VITALS — BP 124/76 | HR 61 | Ht 66.25 in | Wt 234.2 lb

## 2022-09-21 DIAGNOSIS — I480 Paroxysmal atrial fibrillation: Secondary | ICD-10-CM | POA: Diagnosis not present

## 2022-09-21 NOTE — Progress Notes (Signed)
Electrophysiology Office Note:    Date:  09/21/2022   ID:  Kimberly Lamb, DOB February 02, 1951, MRN [id number]  PCP:  Vanessa Houma, FNP   [facility] Providers Cardiologist:  None     Referring MD: Tessa Lerner, DO   History of Present Illness:    Kimberly Lamb is a 72 y.o. female with a hx listed below, significant for nonobstructive coronary disease, hypertension, hyperlipidemia, paroxysmal atrial fibrillation who presents for hospital follow-up.  She has a history of atrial fibrillation managed with flecainide.  She began to have breakthrough episodes of atrial fibrillation and was hospitalized in May with atrial fibrillation and rapid ventricular rate.  Flecainide was discontinued and amiodarone started.  Because she had history of torn rotator cuff during the DC cardioversion, we opted to discharge her in atrial fibrillation to continue her amiodarone load.  She has since converted to sinus rhythm and feels better.  Past Medical History:  Diagnosis Date   Anxiety    Coronary artery calcification    Heart murmur    Hyperlipidemia    Hypertension    Nonobstructive atherosclerosis of coronary artery     Past Surgical History:  Procedure Laterality Date   ACHILLES TENDON SURGERY  2010   BUBBLE STUDY  04/22/2022   Procedure: BUBBLE STUDY;  Surgeon: Tessa Lerner, DO;  Location: [facility];  Service: Cardiovascular;;   CARDIOVERSION N/A 04/22/2022   Procedure: CARDIOVERSION;  Surgeon: Tessa Lerner, DO;  Location: [facility];  Service: Cardiovascular;  Laterality: N/A;   knee replacement Right 2009   x3 2009, 2012, 2014   ROTATOR CUFF REPAIR  2011   Right   SPINAL FUSION     l3   TEE WITHOUT CARDIOVERSION N/A 04/22/2022   Procedure: TRANSESOPHAGEAL ECHOCARDIOGRAM (TEE);  Surgeon: Tessa Lerner, DO;  Location: [facility];  Service: Cardiovascular;  Laterality: N/A;   VAGINAL HYSTERECTOMY  1986    Current Medications: Current Meds  Medication Sig   amiodarone  (PACERONE) 200 MG tablet Take 1 tablet (200 mg total) by mouth daily for 90 doses.   apixaban (ELIQUIS) 5 MG TABS tablet Take 1 tablet (5 mg total) by mouth 2 (two) times daily.   atorvastatin (LIPITOR) 20 MG tablet Take 1 tablet (20 mg total) by mouth at bedtime.   dicyclomine (BENTYL) 20 MG tablet Take 20 mg by mouth 2 (two) times daily.   furosemide (LASIX) 20 MG tablet Take 0.5 tablets (10 mg total) by mouth daily as needed (leg swelling).   hydrochlorothiazide (HYDRODIURIL) 25 MG tablet Take 1 tablet (25 mg total) by mouth in the morning.   lisinopril (ZESTRIL) 40 MG tablet Take 1 tablet (40 mg total) by mouth daily.   metoprolol succinate (TOPROL-XL) 25 MG 24 hr tablet Take 1 tablet (25 mg total) by mouth every morning. Take with or immediately following a meal.   nitroGLYCERIN (NITROSTAT) 0.4 MG SL tablet Place 1 tablet (0.4 mg total) under the tongue every 5 (five) minutes as needed for chest pain.   pantoprazole (PROTONIX) 40 MG tablet Take 40 mg by mouth 2 (two) times daily.   PARoxetine (PAXIL) 30 MG tablet Take 30 mg by mouth daily.   potassium chloride SA (KLOR-CON M) 20 MEQ tablet Take 1 tablet (20 mEq total) by mouth daily.     Allergies:   Patient has no known allergies.   Social and Family History: Reviewed in Epic  ROS:   Please see the history of present illness.    All other systems  reviewed and are negative.  EKGs/Labs/Other Studies Reviewed Today:    Echocardiogram:  TEE 04/2022 EF 50-55%, LA moderately dilated   Monitors:   Stress testing:   Advanced imaging:   Cardiac catherization   EKG:  Last EKG results: today sinus bradycardia, 57 bpm   Recent Labs: 08/19/2022: B Natriuretic Peptide 209.2; Hemoglobin 13.2; Platelets 280; TSH 1.543 08/21/2022: ALT 23 08/22/2022: BUN 24; Creatinine, Ser 0.86; Magnesium 2.0; Potassium 4.4; Sodium 139     Physical Exam:    VS:  BP 124/76   Pulse 61   Ht 5' 6.25" (1.683 m)   Wt 234 lb 3.2 oz (106.2 kg)   SpO2  96%   BMI 37.52 kg/m     Wt Readings from Last 3 Encounters:  09/21/22 234 lb 3.2 oz (106.2 kg)  08/31/22 233 lb (105.7 kg)  08/21/22 237 lb 9.6 oz (107.8 kg)     GEN: Well nourished, well developed in no acute distress CARDIAC: RRR, no murmurs, rubs, gallops RESPIRATORY:  Normal work of breathing MUSCULOSKELETAL: no edema    ASSESSMENT & PLAN:    Persistent atrial fibrillation Failed flecainide Symptomatic She presents today to schedule AF ablation Plan to DC amiodarone at the time of ablation  We discussed the indication, rationale, logistics, anticipated benefits, and potential risks of the ablation procedure including but not limited to -- bleed at the groin access site, chest pain, damage to nearby organs such as the diaphragm, lungs, or esophagus, need for a drainage tube, or prolonged hospitalization. I explained that the risk for stroke, heart attack, need for open chest surgery, or even death is very low but not zero. she  expressed understanding and wishes to proceed.   Obstructive Sleep apnea I commended her on her compliance with CPAP  Obesity We discussed the importance of weight loss for maintenance of sinus rhythm and to improve the safety of the procedure.  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 Will evaluate with EP study during AF ablation.  PFO Noted on CT and TEE          Medication Adjustments/Labs and Tests Ordered: Current medicines are reviewed at length with the patient today.  Concerns regarding medicines are outlined above.  No orders of the defined types were placed in this encounter.  No orders of the defined types were placed in this encounter.    Signed, Maurice Small, MD  09/21/2022 12:28 PM    [facility]

## 2022-09-21 NOTE — Patient Instructions (Signed)
Medication Instructions:  Your physician recommends that you continue on your current medications as directed. Please refer to the Current Medication list given to you today. *If you need a refill on your cardiac medications before your next appointment, please call your pharmacy*   Testing/Procedures: Atrial Fibrillation Ablation Your physician has recommended that you have an ablation. Catheter ablation is a medical procedure used to treat some cardiac arrhythmias (irregular heartbeats). During catheter ablation, a long, thin, flexible tube is put into a blood vessel in your groin (upper thigh), or neck. This tube is called an ablation catheter. It is then guided to your heart through the blood vessel. Radio frequency waves destroy small areas of heart tissue where abnormal heartbeats may cause an arrhythmia to start. Please see the instruction sheet given to you today.  You are scheduled for Atrial Fibrillation Ablation on Thursday, December 5 with Dr. Halford Chessman.Please arrive at the Main Entrance A at [facility]: [address] [place], [place] [address]    Follow-Up: At [facility], you and your health needs are our priority.  As part of our continuing mission to provide you with exceptional heart care, we have created designated Provider Care Teams.  These Care Teams include your primary Cardiologist (physician) and Advanced Practice Providers (APPs -  Physician Assistants and Nurse Practitioners) who all work together to provide you with the care you need, when you need it.  We recommend signing up for the patient portal called "MyChart".  Sign up information is provided on this After Visit Summary.  MyChart is used to connect with patients for Virtual Visits (Telemedicine).  Patients are able to view lab/test results, encounter notes, upcoming appointments, etc.  Non-urgent messages can be sent to your provider as well.   To learn more about what you can do with MyChart,  go to [email or web address].    Your next appointment:   March 02, 2023  Provider:   York Pellant, MD

## 2022-09-25 ENCOUNTER — Institutional Professional Consult (permissible substitution): Payer: No Typology Code available for payment source | Admitting: Cardiology

## 2022-12-01 ENCOUNTER — Ambulatory Visit: Payer: Medicare Other | Admitting: Cardiology

## 2022-12-16 ENCOUNTER — Telehealth: Payer: Self-pay

## 2022-12-16 ENCOUNTER — Other Ambulatory Visit: Payer: Self-pay

## 2022-12-16 MED ORDER — AMIODARONE HCL 200 MG PO TABS: 200.0000 mg | ORAL_TABLET | Freq: Every day | ORAL | 0 refills | Status: DC

## 2022-12-16 NOTE — Telephone Encounter (Signed)
Medication has been refilled to patient's preferred pharmacy

## 2022-12-16 NOTE — Telephone Encounter (Signed)
Yes ST

## 2022-12-17 ENCOUNTER — Other Ambulatory Visit: Payer: Self-pay

## 2022-12-17 MED ORDER — AMIODARONE HCL 200 MG PO TABS: 200.0000 mg | ORAL_TABLET | Freq: Every day | ORAL | 0 refills | Status: DC

## 2022-12-20 ENCOUNTER — Other Ambulatory Visit: Payer: Self-pay | Admitting: Cardiology

## 2023-01-18 LAB — COLOGUARD: COLOGUARD: POSITIVE — AB

## 2023-01-18 LAB — EXTERNAL GENERIC LAB PROCEDURE: COLOGUARD: POSITIVE — AB

## 2023-02-18 ENCOUNTER — Telehealth: Payer: Self-pay

## 2023-02-18 DIAGNOSIS — I480 Paroxysmal atrial fibrillation: Secondary | ICD-10-CM

## 2023-02-18 NOTE — Telephone Encounter (Signed)
Called Kimberly Lamb to go over ablation instructions. Moved her pre ablation appt with Dr. Nelly Laurence from [place] office to [place] office - Kimberly Lamb lives in [place], [place].   Pre-ablation appt with [facility] on 11/8 at 2:30 She will have labs done at [facility] in [place] CT Scan is 11/13 at 4:00 at [facility] Ablation is 12/5 at 12:30 at [facility]  Instruction letters will be mailed to Kimberly Lamb per her request.

## 2023-02-26 ENCOUNTER — Ambulatory Visit: Payer: Medicare Other | Attending: Cardiovascular Disease | Admitting: Cardiovascular Disease

## 2023-02-26 ENCOUNTER — Encounter: Payer: Self-pay | Admitting: Cardiovascular Disease

## 2023-02-26 VITALS — BP 136/80 | HR 52 | Ht 66.25 in | Wt 240.0 lb

## 2023-02-26 DIAGNOSIS — I480 Paroxysmal atrial fibrillation: Secondary | ICD-10-CM | POA: Diagnosis not present

## 2023-02-26 DIAGNOSIS — G4730 Sleep apnea, unspecified: Secondary | ICD-10-CM | POA: Insufficient documentation

## 2023-02-26 LAB — BASIC METABOLIC PANEL
BUN/Creatinine Ratio: 26 (ref 12–28)
BUN: 21 mg/dL (ref 8–27)
CO2: 24 mmol/L (ref 20–29)
Calcium: 9.5 mg/dL (ref 8.7–10.3)
Chloride: 104 mmol/L (ref 96–106)
Creatinine, Ser: 0.82 mg/dL (ref 0.57–1.00)
Glucose: 100 mg/dL — ABNORMAL HIGH (ref 70–99)
Potassium: 4.4 mmol/L (ref 3.5–5.2)
Sodium: 142 mmol/L (ref 134–144)
eGFR: 75 mL/min/{1.73_m2} (ref >59)

## 2023-02-26 LAB — CBC
Hematocrit: 38.1 % (ref 34.0–46.6)
Hemoglobin: 11.9 g/dL (ref 11.1–15.9)
MCH: 26.8 pg (ref 26.6–33.0)
MCHC: 31.2 g/dL — ABNORMAL LOW (ref 31.5–35.7)
MCV: 86 fL (ref 79–97)
Platelets: 272 10*3/uL (ref 150–450)
RBC: 4.44 x10E6/uL (ref 3.77–5.28)
RDW: 14.3 % (ref 11.7–15.4)
WBC: 6.1 10*3/uL (ref 3.4–10.8)

## 2023-02-26 NOTE — H&P (View-Only) (Signed)
  Electrophysiology Office Note:    Date:  02/26/2023   ID:  Kimberly Lamb, DOB [birth year], MRN [id number]  PCP:  Vanessa Throckmorton, FNP   [facility] Providers Cardiologist:  None Electrophysiologist:  Maurice Small, MD     Referring MD: Vanessa Wadsworth, FNP   History of Present Illness:    Kimberly Lamb is a 72 y.o. female with a hx listed below, significant for nonobstructive coronary disease, hypertension, hyperlipidemia, paroxysmal atrial fibrillation who presents for hospital follow-up.  She has a history of atrial fibrillation managed with flecainide.  She began to have breakthrough episodes of atrial fibrillation and was hospitalized in May with atrial fibrillation and rapid ventricular rate.  Flecainide was discontinued and amiodarone started.  Because she had history of torn rotator cuff during the DC cardioversion, we opted to discharge her in atrial fibrillation to continue her amiodarone load.  She has since converted to sinus rhythm and feels better.  She presents today for follow-up prior to atrial fibrillation ablation which scheduled for December 5.  She reports that she has not had any changes in her condition, diagnoses, or medications.  She experiences A-fib symptoms for a little while at least daily.  She has been under evaluation for sleep apnea and was told, per her understanding, that sleep study and CPAP titration should be deferred until after her atrial fibrillation ablation.   EKGs/Labs/Other Studies Reviewed Today:    Echocardiogram:  TEE 04/2022 EF 50-55%, LA moderately dilated   Monitors:   Stress testing:   Advanced imaging:   Cardiac catherization   EKG:  Last EKG results: today sinus bradycardia, 57 bpm   Recent Labs: 08/19/2022: B Natriuretic Peptide 209.2; TSH 1.543 08/21/2022: ALT 23 08/22/2022: Magnesium 2.0 02/25/2023: BUN 21; Creatinine, Ser 0.82; Hemoglobin 11.9; Platelets 272; Potassium 4.4; Sodium 142     Physical  Exam:    VS:  BP 136/80   Pulse (!) 52   Ht 5' 6.25" (1.683 m)   Wt 240 lb (108.9 kg)   SpO2 95%   BMI 38.45 kg/m     Wt Readings from Last 3 Encounters:  02/26/23 240 lb (108.9 kg)  09/21/22 234 lb 3.2 oz (106.2 kg)  08/31/22 233 lb (105.7 kg)     GEN: Well nourished, well developed in no acute distress CARDIAC: RRR, no murmurs, rubs, gallops RESPIRATORY:  Normal work of breathing MUSCULOSKELETAL: no edema    ASSESSMENT & PLAN:    Persistent atrial fibrillation Failed flecainide Symptomatic She presents today prior to scheduled AF ablation Plan to DC amiodarone at the time of ablation  Obstructive Sleep apnea I commended her on her compliance with CPAP She requests to transfer to [facility] for OSA management  Obesity We discussed the importance of weight loss for maintenance of sinus rhythm and to improve the safety of the procedure.  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 Will evaluate with EP study during AF ablation.  PFO Noted on CT and TEE          Medication Adjustments/Labs and Tests Ordered: Current medicines are reviewed at length with the patient today.  Concerns regarding medicines are outlined above.  Orders Placed This Encounter  Procedures   EKG 12-Lead   No orders of the defined types were placed in this encounter.    Signed, Maurice Small, MD  02/26/2023 3:23 PM    [facility]

## 2023-02-26 NOTE — Patient Instructions (Addendum)
Medication Instructions:  Your physician recommends that you continue on your current medications as directed. Please refer to the Current Medication list given to you today.  Labwork: none  Testing/Procedures: none  Follow-Up: Your physician recommends that you schedule a follow-up appointment in: as planned  Any Other Special Instructions Will Be Listed Below (If Applicable). You have been referred to Dr. Mayford Knife  If you need a refill on your cardiac medications before your next appointment, please call your pharmacy.

## 2023-02-26 NOTE — Progress Notes (Signed)
  Electrophysiology Office Note:    Date:  02/26/2023   ID:  Kimberly Lamb, DOB [birth year], MRN [id number]  PCP:  Kimberly Throckmorton, FNP   [facility] Providers Cardiologist:  None Electrophysiologist:  Kimberly Small, MD     Referring MD: Kimberly Wadsworth, FNP   History of Present Illness:    Kimberly Lamb is a 72 y.o. female with a hx listed below, significant for nonobstructive coronary disease, hypertension, hyperlipidemia, paroxysmal atrial fibrillation who presents for hospital follow-up.  She has a history of atrial fibrillation managed with flecainide.  She began to have breakthrough episodes of atrial fibrillation and was hospitalized in May with atrial fibrillation and rapid ventricular rate.  Flecainide was discontinued and amiodarone started.  Because she had history of torn rotator cuff during the DC cardioversion, we opted to discharge her in atrial fibrillation to continue her amiodarone load.  She has since converted to sinus rhythm and feels better.  She presents today for follow-up prior to atrial fibrillation ablation which scheduled for December 5.  She reports that she has not had any changes in her condition, diagnoses, or medications.  She experiences A-fib symptoms for a little while at least daily.  She has been under evaluation for sleep apnea and was told, per her understanding, that sleep study and CPAP titration should be deferred until after her atrial fibrillation ablation.   EKGs/Labs/Other Studies Reviewed Today:    Echocardiogram:  TEE 04/2022 EF 50-55%, LA moderately dilated   Monitors:   Stress testing:   Advanced imaging:   Cardiac catherization   EKG:  Last EKG results: today sinus bradycardia, 57 bpm   Recent Labs: 08/19/2022: B Natriuretic Peptide 209.2; TSH 1.543 08/21/2022: ALT 23 08/22/2022: Magnesium 2.0 02/25/2023: BUN 21; Creatinine, Ser 0.82; Hemoglobin 11.9; Platelets 272; Potassium 4.4; Sodium 142     Physical  Exam:    VS:  BP 136/80   Pulse (!) 52   Ht 5' 6.25" (1.683 m)   Wt 240 lb (108.9 kg)   SpO2 95%   BMI 38.45 kg/m     Wt Readings from Last 3 Encounters:  02/26/23 240 lb (108.9 kg)  09/21/22 234 lb 3.2 oz (106.2 kg)  08/31/22 233 lb (105.7 kg)     GEN: Well nourished, well developed in no acute distress CARDIAC: RRR, no murmurs, rubs, gallops RESPIRATORY:  Normal work of breathing MUSCULOSKELETAL: no edema    ASSESSMENT & PLAN:    Persistent atrial fibrillation Failed flecainide Symptomatic She presents today prior to scheduled AF ablation Plan to DC amiodarone at the time of ablation  Obstructive Sleep apnea I commended her on her compliance with CPAP She requests to transfer to [facility] for OSA management  Obesity We discussed the importance of weight loss for maintenance of sinus rhythm and to improve the safety of the procedure.  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 Will evaluate with EP study during AF ablation.  PFO Noted on CT and TEE          Medication Adjustments/Labs and Tests Ordered: Current medicines are reviewed at length with the patient today.  Concerns regarding medicines are outlined above.  Orders Placed This Encounter  Procedures   EKG 12-Lead   No orders of the defined types were placed in this encounter.    Signed, Kimberly Small, MD  02/26/2023 3:23 PM    [facility]

## 2023-03-02 ENCOUNTER — Ambulatory Visit: Payer: No Typology Code available for payment source | Admitting: Cardiovascular Disease

## 2023-03-03 ENCOUNTER — Ambulatory Visit (HOSPITAL_COMMUNITY)
Admission: RE | Admit: 2023-03-03 | Discharge: 2023-03-03 | Disposition: A | Discharge status: Home / Self Care | Payer: Medicare Other | Source: Ambulatory Visit | Attending: Cardiovascular Disease | Admitting: Cardiovascular Disease

## 2023-03-03 DIAGNOSIS — I480 Paroxysmal atrial fibrillation: Secondary | ICD-10-CM | POA: Insufficient documentation

## 2023-03-03 MED ORDER — IOHEXOL 350 MG/ML SOLN
95.0000 mL | Freq: Once | INTRAVENOUS | Status: AC | PRN
  Administered 2023-03-03: 95 mL via INTRAVENOUS

## 2023-03-22 ENCOUNTER — Other Ambulatory Visit (HOSPITAL_COMMUNITY): Payer: No Typology Code available for payment source

## 2023-03-24 NOTE — Pre-Procedure Instructions (Signed)
Instructed patient on the following items: Arrival time 0930 Nothing to eat or drink after midnight No meds AM of procedure Responsible person to drive you home and stay with you for 24 hrs  Have you missed any doses of anti-coagulant Eliquis- takes twice a day, hasn't missed any doses.  Don't take dose in the morning.

## 2023-03-25 ENCOUNTER — Ambulatory Visit (HOSPITAL_COMMUNITY)
Admission: RE | Admit: 2023-03-25 | Discharge: 2023-03-25 | Disposition: A | Discharge status: Home / Self Care | Payer: Medicare Other | Source: Ambulatory Visit | Attending: Cardiovascular Disease | Admitting: Cardiovascular Disease

## 2023-03-25 ENCOUNTER — Ambulatory Visit (HOSPITAL_COMMUNITY): Payer: Medicare Other

## 2023-03-25 ENCOUNTER — Encounter (HOSPITAL_COMMUNITY)
Admission: RE | Disposition: A | Discharge status: Home / Self Care | Payer: Self-pay | Source: Ambulatory Visit | Attending: Cardiovascular Disease

## 2023-03-25 ENCOUNTER — Ambulatory Visit (HOSPITAL_BASED_OUTPATIENT_CLINIC_OR_DEPARTMENT_OTHER): Payer: Medicare Other

## 2023-03-25 ENCOUNTER — Other Ambulatory Visit: Payer: Self-pay

## 2023-03-25 DIAGNOSIS — I4819 Other persistent atrial fibrillation: Secondary | ICD-10-CM | POA: Diagnosis present

## 2023-03-25 DIAGNOSIS — E669 Obesity, unspecified: Secondary | ICD-10-CM | POA: Diagnosis not present

## 2023-03-25 DIAGNOSIS — Z6838 Body mass index (BMI) 38.0-38.9, adult: Secondary | ICD-10-CM | POA: Diagnosis not present

## 2023-03-25 DIAGNOSIS — I2510 Atherosclerotic heart disease of native coronary artery without angina pectoris: Secondary | ICD-10-CM | POA: Insufficient documentation

## 2023-03-25 DIAGNOSIS — Z79899 Other long term (current) drug therapy: Secondary | ICD-10-CM | POA: Diagnosis not present

## 2023-03-25 DIAGNOSIS — I10 Essential (primary) hypertension: Secondary | ICD-10-CM | POA: Insufficient documentation

## 2023-03-25 DIAGNOSIS — Q2112 Patent foramen ovale: Secondary | ICD-10-CM | POA: Insufficient documentation

## 2023-03-25 DIAGNOSIS — G4733 Obstructive sleep apnea (adult) (pediatric): Secondary | ICD-10-CM | POA: Diagnosis not present

## 2023-03-25 DIAGNOSIS — E785 Hyperlipidemia, unspecified: Secondary | ICD-10-CM | POA: Diagnosis not present

## 2023-03-25 DIAGNOSIS — I471 Supraventricular tachycardia, unspecified: Secondary | ICD-10-CM | POA: Diagnosis not present

## 2023-03-25 HISTORY — PX: ATRIAL FIBRILLATION ABLATION: EP1191

## 2023-03-25 LAB — POCT ACTIVATED CLOTTING TIME: Activated Clotting Time: 371 s

## 2023-03-25 SURGERY — ATRIAL FIBRILLATION ABLATION
Anesthesia: General

## 2023-03-25 MED ORDER — FENTANYL CITRATE (PF) 250 MCG/5ML IJ SOLN
INTRAMUSCULAR | Status: DC | PRN
  Administered 2023-03-25 (×2): 50 ug via INTRAVENOUS
  Administered 2023-03-25: 100 ug via INTRAVENOUS

## 2023-03-25 MED ORDER — PROPOFOL 500 MG/50ML IV EMUL
INTRAVENOUS | Status: DC | PRN
  Administered 2023-03-25: 70 ug/kg/min via INTRAVENOUS

## 2023-03-25 MED ORDER — ONDANSETRON HCL 4 MG/2ML IJ SOLN
INTRAMUSCULAR | Status: DC | PRN
  Administered 2023-03-25: 4 mg via INTRAVENOUS

## 2023-03-25 MED ORDER — SUGAMMADEX SODIUM 200 MG/2ML IV SOLN
INTRAVENOUS | Status: DC | PRN
  Administered 2023-03-25: 200 mg via INTRAVENOUS

## 2023-03-25 MED ORDER — SODIUM CHLORIDE 0.9 % IV SOLN: INTRAVENOUS | Status: DC

## 2023-03-25 MED ORDER — HEPARIN (PORCINE) IN NACL 1000-0.9 UT/500ML-% IV SOLN
INTRAVENOUS | Status: DC | PRN
  Administered 2023-03-25 (×3): 500 mL

## 2023-03-25 MED ORDER — DEXAMETHASONE SODIUM PHOSPHATE 10 MG/ML IJ SOLN
INTRAMUSCULAR | Status: DC | PRN
  Administered 2023-03-25: 10 mg via INTRAVENOUS

## 2023-03-25 MED ORDER — SODIUM CHLORIDE 0.9% FLUSH: 3.0000 mL | INTRAVENOUS | Status: DC | PRN

## 2023-03-25 MED ORDER — ACETAMINOPHEN 325 MG PO TABS
650.0000 mg | ORAL_TABLET | ORAL | Status: DC | PRN
Start: 2023-03-25 — End: 2023-03-26

## 2023-03-25 MED ORDER — SODIUM CHLORIDE 0.9% FLUSH: 3.0000 mL | Freq: Two times a day (BID) | INTRAVENOUS | Status: DC

## 2023-03-25 MED ORDER — ROCURONIUM BROMIDE 10 MG/ML (PF) SYRINGE
PREFILLED_SYRINGE | INTRAVENOUS | Status: DC | PRN
  Administered 2023-03-25: 30 mg via INTRAVENOUS
  Administered 2023-03-25: 50 mg via INTRAVENOUS

## 2023-03-25 MED ORDER — PROTAMINE SULFATE 10 MG/ML IV SOLN
INTRAVENOUS | Status: DC | PRN
  Administered 2023-03-25: 50 mg via INTRAVENOUS

## 2023-03-25 MED ORDER — SODIUM CHLORIDE 0.9 % IV SOLN: 250.0000 mL | INTRAVENOUS | Status: DC | PRN

## 2023-03-25 MED ORDER — HEPARIN SODIUM (PORCINE) 1000 UNIT/ML IJ SOLN
INTRAMUSCULAR | Status: DC | PRN
Start: 2023-03-25 — End: 2023-03-25
  Administered 2023-03-25: 20000 [IU] via INTRAVENOUS

## 2023-03-25 MED ORDER — ATROPINE SULFATE 1 MG/ML IV SOLN
INTRAVENOUS | Status: DC | PRN
  Administered 2023-03-25: 1 mg via INTRAVENOUS

## 2023-03-25 MED ORDER — ONDANSETRON HCL 4 MG/2ML IJ SOLN: 4.0000 mg | Freq: Four times a day (QID) | INTRAMUSCULAR | Status: DC | PRN

## 2023-03-25 MED ORDER — PROPOFOL 10 MG/ML IV BOLUS
INTRAVENOUS | Status: DC | PRN
  Administered 2023-03-25: 100 mg via INTRAVENOUS
  Administered 2023-03-25 (×2): 50 mg via INTRAVENOUS

## 2023-03-25 MED ORDER — MIDAZOLAM HCL 2 MG/2ML IJ SOLN
INTRAMUSCULAR | Status: DC | PRN
  Administered 2023-03-25: 2 mg via INTRAVENOUS

## 2023-03-25 MED ORDER — LIDOCAINE 2% (20 MG/ML) 5 ML SYRINGE
INTRAMUSCULAR | Status: DC | PRN
  Administered 2023-03-25: 40 mg via INTRAVENOUS

## 2023-03-25 SURGICAL SUPPLY — 22 items
BAG SNAP BAND KOVER 36X36 (MISCELLANEOUS) IMPLANT
BLANKET WARM UNDERBOD FULL ACC (MISCELLANEOUS) ×1 IMPLANT
CABLE PFA RX CATH CONN (CABLE) IMPLANT
CATH FARAWAVE ABLATION 31 (CATHETERS) IMPLANT
CATH OCTARAY 2.0 F 3-3-3-3-3 (CATHETERS) IMPLANT
CATH SOUNDSTAR ECO 8FR (CATHETERS) IMPLANT
CATH WEB BI DIR CSDF CRV REPRO (CATHETERS) IMPLANT
CLOSURE PERCLOSE PROSTYLE (VASCULAR PRODUCTS) IMPLANT
COVER SWIFTLINK CONNECTOR (BAG) ×1 IMPLANT
DEVICE CLOSURE MYNXGRIP 6/7F (Vascular Products) IMPLANT
DILATOR VESSEL 38 20CM 16FR (INTRODUCER) IMPLANT
GUIDEWIRE INQWIRE 1.5J.035X260 (WIRE) IMPLANT
INQWIRE 1.5J .035X260CM (WIRE) ×1
MAT PREVALON FULL STRYKER (MISCELLANEOUS) IMPLANT
PACK EP LF (CUSTOM PROCEDURE TRAY) ×1 IMPLANT
PAD DEFIB RADIO PHYSIO CONN (PAD) ×1 IMPLANT
PATCH CARTO3 (PAD) IMPLANT
SHEATH FARADRIVE STEERABLE (SHEATH) IMPLANT
SHEATH PINNACLE 8F 10CM (SHEATH) IMPLANT
SHEATH PINNACLE 9F 10CM (SHEATH) IMPLANT
SHEATH PROBE COVER 6X72 (BAG) IMPLANT
SHEATH WIRE KIT BAYLIS SL1 (KITS) IMPLANT

## 2023-03-25 NOTE — Interval H&P Note (Signed)
History and Physical Interval Note:  03/25/2023 11:23 AM  Kimberly Lamb  has presented today for surgery, with the diagnosis of afib.  The various methods of treatment have been discussed with the patient and family. After consideration of risks, benefits and other options for treatment, the patient has consented to  Procedure(s): ATRIAL FIBRILLATION ABLATION (N/A) as a surgical intervention.  The patient's history has been reviewed, patient examined, no change in status, stable for surgery.  I have reviewed the patient's chart and labs.  Questions were answered to the patient's satisfaction.    I reviewed the patient's CT and labs. There was no LAA thrombus. she  has not missed any doses of anticoagulation, and she took her dose last night. There have been no changes in the patient's diagnoses, medications, or condition since our recent clinic visit.   Kimberly Lamb

## 2023-03-25 NOTE — Transfer of Care (Signed)
Immediate Anesthesia Transfer of Care Note  Patient: Kimberly Lamb  Procedure(s) Performed: ATRIAL FIBRILLATION ABLATION  Patient Location: PACU and Cath Lab  Anesthesia Type:General  Level of Consciousness: awake and alert   Airway & Oxygen Therapy: Patient Spontanous Breathing and Patient connected to nasal cannula oxygen  Post-op Assessment: Report given to RN  Post vital signs: stable  Last Vitals:  Vitals Value Taken Time  BP 100/50 03/25/23 1325  Temp 36.3 C 03/25/23 1325  Pulse 62 03/25/23 1328  Resp 23 03/25/23 1328  SpO2 94 % 03/25/23 1328  Vitals shown include unfiled device data.  Last Pain:  Vitals:   03/25/23 1325  TempSrc: Temporal  PainSc:          Complications: There were no known notable events for this encounter.

## 2023-03-25 NOTE — Anesthesia Procedure Notes (Signed)
Procedure Name: Intubation Date/Time: 03/25/2023 11:56 AM  Performed by: Vena Austria, CRNAPre-anesthesia Checklist: Patient identified, Emergency Drugs available, Suction available, Patient being monitored and Timeout performed Patient Re-evaluated:Patient Re-evaluated prior to induction Oxygen Delivery Method: Circle system utilized Preoxygenation: Pre-oxygenation with 100% oxygen Induction Type: IV induction Ventilation: Mask ventilation without difficulty Laryngoscope Size: McGrath and 3 Grade View: Grade I Tube type: Oral Tube size: 7.0 mm Number of attempts: 1 Placement Confirmation: ETT inserted through vocal cords under direct vision, positive ETCO2, CO2 detector and breath sounds checked- equal and bilateral Secured at: 22 cm Tube secured with: Tape Dental Injury: Teeth and Oropharynx as per pre-operative assessment

## 2023-03-25 NOTE — Discharge Instructions (Addendum)
Cardiac Ablation, Care After  This sheet gives you information about how to care for yourself after your procedure. Your health care provider may also give you more specific instructions. If you have problems or questions, contact your health care provider. What can I expect after the procedure? After the procedure, it is common to have: Bruising around your puncture site. Tenderness around your puncture site. Skipped heartbeats. If you had an atrial fibrillation ablation, you may have atrial fibrillation during the first several months after your procedure.  Tiredness (fatigue).  Follow these instructions at home: Puncture site care  Follow instructions from your health care provider about how to take care of your puncture site. Make sure you: If present, leave stitches (sutures), skin glue, or adhesive strips in place. These skin closures may need to stay in place for up to 2 weeks. If adhesive strip edges start to loosen and curl up, you may trim the loose edges. Do not remove adhesive strips completely unless your health care provider tells you to do that. A square bandage is present, at the groin.  You may removed tomorrow at 3:00 pm. Check your puncture site every day for signs of infection. Check for: Redness, swelling, or pain. Fluid or blood. If your puncture site starts to bleed, lie down on your back, apply firm pressure to the area, and contact your health care provider. Warmth. Pus or a bad smell. A pea or small marble sized lump at the site is normal and can take up to three months to resolve.  Driving Do not drive for at least 4 days after your procedure or however long your health care provider recommends. (Do not resume driving if you have previously been instructed not to drive for other health reasons.) Do not drive or use heavy machinery while taking prescription pain medicine. Activity Avoid activities that take a lot of effort for at least 7 days after your  procedure. Do not lift anything that is heavier than 5 lb (4.5 kg) for one week.  No sexual activity for 1 week.  Return to your normal activities as told by your health care provider. Ask your health care provider what activities are safe for you. General instructions Take over-the-counter and prescription medicines only as told by your health care provider. Do not use any products that contain nicotine or tobacco, such as cigarettes and e-cigarettes. If you need help quitting, ask your health care provider. You may shower after you removed the dressings. Do not take baths, swim, or use a hot tub for 1 week.  Do not drink alcohol for 24 hours after your procedure. Keep all follow-up visits as told by your health care provider. This is important. Contact a health care provider if: You have redness, mild swelling, or pain around your puncture site. You have fluid or blood coming from your puncture site that stops after applying firm pressure to the area. Your puncture site feels warm to the touch. You have pus or a bad smell coming from your puncture site. You have a fever. You have chest pain or discomfort that spreads to your neck, jaw, or arm. You have chest pain that is worse with lying on your back or taking a deep breath. You are sweating a lot. You feel nauseous. You have a fast or irregular heartbeat. You have shortness of breath. You are dizzy or light-headed and feel the need to lie down. You have pain or numbness in the arm or leg closest to your  puncture site. Get help right away if: Your puncture site suddenly swells. Lay flat apply steady pressure for 15 minutes Your puncture site is bleeding and the bleeding does not stop after applying firm pressure to the area for 15 minutes. These symptoms may represent a serious problem that is an emergency. Do not wait to see if the symptoms will go away. Get medical help right away. Call your local emergency services (911 in the [place]  Do not drive yourself to the hospital. Summary After the procedure, it is normal to have bruising and tenderness at the puncture site in your groin, neck, or forearm. Check your puncture site every day for signs of infection. Get help right away if your puncture site is bleeding and the bleeding does not stop after applying firm pressure to the area. This is a medical emergency. This information is not intended to replace advice given to you by your health care provider. Make sure you discuss any questions you have with your health care provider.

## 2023-03-25 NOTE — Anesthesia Preprocedure Evaluation (Signed)
Anesthesia Evaluation  Patient identified by MRN, date of birth, ID band Patient awake    Reviewed: Allergy & Precautions, H&P , NPO status , Patient's Chart, lab work & pertinent test results  Airway Mallampati: II   Neck ROM: full    Dental   Pulmonary sleep apnea    breath sounds clear to auscultation       Cardiovascular hypertension, + CAD  + dysrhythmias Atrial Fibrillation  Rhythm:irregular Rate:Normal     Neuro/Psych   Anxiety        GI/Hepatic   Endo/Other    Renal/GU      Musculoskeletal   Abdominal   Peds  Hematology   Anesthesia Other Findings   Reproductive/Obstetrics                             Anesthesia Physical Anesthesia Plan  ASA: 3  Anesthesia Plan: General   Post-op Pain Management:    Induction: Intravenous  PONV Risk Score and Plan: 3 and Ondansetron, Dexamethasone, Midazolam and Treatment may vary due to age or medical condition  Airway Management Planned: Oral ETT  Additional Equipment:   Intra-op Plan:   Post-operative Plan: Extubation in OR  Informed Consent: I have reviewed the patients History and Physical, chart, labs and discussed the procedure including the risks, benefits and alternatives for the proposed anesthesia with the patient or authorized representative who has indicated his/her understanding and acceptance.     Dental advisory given  Plan Discussed with: CRNA, Anesthesiologist and Surgeon  Anesthesia Plan Comments:        Anesthesia Quick Evaluation

## 2023-03-26 ENCOUNTER — Encounter (HOSPITAL_COMMUNITY): Payer: Self-pay | Admitting: Cardiovascular Disease

## 2023-03-26 NOTE — Anesthesia Postprocedure Evaluation (Signed)
Anesthesia Post Note  Patient: Kimberly Lamb  Procedure(s) Performed: ATRIAL FIBRILLATION ABLATION     Patient location during evaluation: PACU Anesthesia Type: General Level of consciousness: awake and alert Pain management: pain level controlled Vital Signs Assessment: post-procedure vital signs reviewed and stable Respiratory status: spontaneous breathing, nonlabored ventilation, respiratory function stable and patient connected to nasal cannula oxygen Cardiovascular status: blood pressure returned to baseline and stable Postop Assessment: no apparent nausea or vomiting Anesthetic complications: no   There were no known notable events for this encounter.  Last Vitals:  Vitals:   03/25/23 1600 03/25/23 1659  BP: (!) 149/72 137/60  Pulse: (!) 56 (!) 57  Resp: (!) 23 (!) 25  Temp:    SpO2: 96% 93%    Last Pain:  Vitals:   03/25/23 1659  TempSrc:   PainSc: 0-No pain                 Geral Coker S

## 2023-03-30 ENCOUNTER — Ambulatory Visit: Payer: Self-pay | Admitting: Cardiology

## 2023-04-02 ENCOUNTER — Telehealth: Payer: Self-pay | Admitting: Cardiovascular Disease

## 2023-04-02 NOTE — Telephone Encounter (Signed)
Spoke with patient, left groin site has more bruising than right and patient wanted to confirm this could be normal. Patient states small "knot" under the skin about the size of a nickel but no redness or drainage. Patient reassured. No further needs at this time

## 2023-04-02 NOTE — Telephone Encounter (Signed)
Patient's daughter has concerns with bruising post-ablation and she would like to know if it is normal. She states near left groin there was bruising the size of half a grapefruit and patient's skin felt hard to touch, but patient didn't notice it. Daughter states this morning it went down to the size of a golf ball. Please advise.

## 2023-04-19 ENCOUNTER — Ambulatory Visit: Payer: Medicare Other | Attending: Cardiology | Admitting: Cardiology

## 2023-04-19 ENCOUNTER — Encounter: Payer: Self-pay | Admitting: Cardiology

## 2023-04-19 VITALS — BP 128/78 | HR 52 | Resp 16 | Ht 66.0 in | Wt 231.6 lb

## 2023-04-19 DIAGNOSIS — Z6837 Body mass index (BMI) 37.0-37.9, adult: Secondary | ICD-10-CM | POA: Diagnosis present

## 2023-04-19 DIAGNOSIS — E66812 Obesity, class 2: Secondary | ICD-10-CM | POA: Diagnosis present

## 2023-04-19 DIAGNOSIS — E6609 Other obesity due to excess calories: Secondary | ICD-10-CM | POA: Diagnosis present

## 2023-04-19 DIAGNOSIS — E782 Mixed hyperlipidemia: Secondary | ICD-10-CM | POA: Diagnosis present

## 2023-04-19 DIAGNOSIS — I10 Essential (primary) hypertension: Secondary | ICD-10-CM

## 2023-04-19 DIAGNOSIS — Z7901 Long term (current) use of anticoagulants: Secondary | ICD-10-CM | POA: Diagnosis present

## 2023-04-19 DIAGNOSIS — G4730 Sleep apnea, unspecified: Secondary | ICD-10-CM | POA: Diagnosis not present

## 2023-04-19 DIAGNOSIS — I2510 Atherosclerotic heart disease of native coronary artery without angina pectoris: Secondary | ICD-10-CM | POA: Diagnosis not present

## 2023-04-19 DIAGNOSIS — I4819 Other persistent atrial fibrillation: Secondary | ICD-10-CM | POA: Diagnosis not present

## 2023-04-19 NOTE — Progress Notes (Signed)
Cardiology Office Note:  .   Date:  04/19/2023  ID:  Kimberly Lamb, DOB [birth year], MRN [id number] PCP:  Vanessa Gross, FNP  Former Cardiology Providers: NA [facility] Providers Cardiologist:  Tessa Lerner, DO Electrophysiologist:  Maurice Small, MD , [facility] (established care March 2021) Electrophysiologist:  Maurice Small, MD  Click to update primary MD,subspecialty MD or APP then REFRESH:1}    Chief Complaint  Patient presents with   Follow-up    Persistent atrial fibrillation    History of Present Illness: .   Kimberly Lamb is a 72 y.o.  female whose past medical history and cardiovascular risk factors includes: Persistent atrial fibrillation status post cardioversion and atrial fibrillation ablation, moderate coronary artery calcification, nonobstructive CAD, obstructive sleep apnea on CPAP, benign essential hypertension, obesity, due to excess calories, postmenopausal female, advanced age.   Patient is being followed in the practice given her history of atrial fibrillation, coronary calcification, and minimal nonobstructive CAD.  The patient has undergone cardioversion to restore normal sinus rhythm and has been placed on antiarrhythmic medications in the past.  However due to recurrence of A-fib as well as SVT suggestive of AVNRT she was referred to EP for further evaluation and management.  She underwent atrial fibrillation ablation in December 2024 and presents today for follow-up.  Since procedure she is doing well from a cardiovascular standpoint.   She states that at times she does appreciate going into atrial fibrillation but shortly comes out of it.  She is on low-dose Lopressor for rate control strategy.  She is no longer on antiarrhythmic medications i.e. amiodarone.  Review of Systems: .   Review of Systems  Cardiovascular:  Negative for chest pain, claudication, irregular heartbeat, leg swelling, near-syncope, orthopnea, palpitations, paroxysmal  nocturnal dyspnea and syncope.  Respiratory:  Negative for shortness of breath.   Hematologic/Lymphatic: Negative for bleeding problem.    Studies Reviewed:   EKG: EKG Interpretation Date/Time:  Monday April 19 2023 08:32:53 EST Ventricular Rate:  53 PR Interval:  200 QRS Duration:  92 QT Interval:  482 QTC Calculation: 452 R Axis:   -21  Text Interpretation: Sinus bradycardia with sinus arrhythmia When compared with ECG of 25-Mar-2023 13:37, Premature ventricular complexes are no longer Present Confirmed by Tessa Lerner [phone]) on 04/19/2023 8:42:55 AM  Echocardiogram: TEE 04/22/2022:  LVEF 50 to 55%, normal right ventricular function, Moderately dilated left atrium, mild MR, moderate TR, mild aortic root dilatation 39 mm, aortic atherosclerosis, positive bubble study due to PFO.   Stress Testing:  Eugenie Birks (Walking with mod Bruce)Tetrofosmin Stress Test  08/09/2019: Mild degree Moderate extent perfusion defect consistent with mild (reversible) ischemia located in the mid to distal lateral wall (Left Circumflex Artery region) of left ventricle. Overall LV systolic function is normal without regional wall motion abnormalities. Stress LV EF: 75%. Low risk.   Coronary CTA: 08/24/2019: 1. Coronary calcium score of 248. This was 87th percentile for age and sex matched controls. 2. Normal coronary origin with right dominance.  3. Minimal, non-obstructive CAD (<25%).  4. Small PFO.  5. Moderate mitral annular calcification.  RECOMMENDATIONS: Minimal non-obstructive CAD (0-24%). Consider preventive therapy and risk factor modification.  Cardioversion: 04/22/2022: TEE guided cardioversion: Converted to sinus bradycardia after 200 J x 1. 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   Atrial fibrillation ablation: 03/25/2023: CONCLUSIONS: 1. Sinus rhythm upon presentation.   2. Successful electrical isolation and anatomical encircling of all four pulmonary  veins with pulse field  ablation. 3. Ablation of the posterior wall of the left atrium 4. No early apparent complications.  RADIOLOGY: NA  Risk Assessment/Calculations:   Click Here to Calculate/Change CHADS2VASc Score The patient's CHADS2-VASc score is 4, indicating a 4.8% annual risk of stroke.    CHF History: No HTN History: Yes Diabetes History: No Stroke History: No Vascular Disease History: Yes  Labs:       Latest Ref Rng & Units 02/25/2023   12:52 PM 08/19/2022    4:00 PM 06/25/2022   12:56 PM  CBC  WBC 3.4 - 10.8 x10E3/uL 6.1  9.6  11.8   Hemoglobin 11.1 - 15.9 g/dL 96.0  45.4  09.8   Hematocrit 34.0 - 46.6 % 38.1  41.1  46.5   Platelets 150 - 450 x10E3/uL 272  280  353        Latest Ref Rng & Units 02/25/2023   12:55 PM 08/22/2022    5:59 AM 08/21/2022    9:52 AM  BMP  Glucose 70 - 99 mg/dL 119  147  829   BUN 8 - 27 mg/dL 21  24  17    Creatinine 0.57 - 1.00 mg/dL 5.62  1.30  8.65   BUN/Creat Ratio 12 - 28 26     Sodium 134 - 144 mmol/L 142  139  138   Potassium 3.5 - 5.2 mmol/L 4.4  4.4  4.0   Chloride 96 - 106 mmol/L 104  106  106   CO2 20 - 29 mmol/L 24  22  20    Calcium 8.7 - 10.3 mg/dL 9.5  8.8  9.0       Latest Ref Rng & Units 02/25/2023   12:55 PM 08/22/2022    5:59 AM 08/21/2022    9:52 AM  CMP  Glucose 70 - 99 mg/dL 784  696  295   BUN 8 - 27 mg/dL 21  24  17    Creatinine 0.57 - 1.00 mg/dL 2.84  1.32  4.40   Sodium 134 - 144 mmol/L 142  139  138   Potassium 3.5 - 5.2 mmol/L 4.4  4.4  4.0   Chloride 96 - 106 mmol/L 104  106  106   CO2 20 - 29 mmol/L 24  22  20    Calcium 8.7 - 10.3 mg/dL 9.5  8.8  9.0   Total Protein 6.5 - 8.1 g/dL   6.6   Total Bilirubin 0.3 - 1.2 mg/dL   0.4   Alkaline Phos 38 - 126 U/L   77   AST 15 - 41 U/L   20   ALT 0 - 44 U/L   23     Lab Results  Component Value Date   CHOL 126 10/26/2019   HDL 50 10/26/2019   LDLCALC 63 10/26/2019   TRIG 60 10/26/2019   No results for input(s): "LIPOA" in the last 8760 hours. No  components found for: "NTPROBNP" No results for input(s): "PROBNP" in the last 8760 hours. Recent Labs    08/19/22 1608  TSH 1.543    External Labs: Collected: 05/26/2021 provided by Dr. Argentina Ponder Hemoglobin 14.6 g/dL, hematocrit 10.2% BUN 24, creatinine 0.83. Sodium 140, potassium 4.4, chloride 108, bicarb 30 AST 11, ALT 20, alkaline phosphatase 72. Total cholesterol 145, triglycerides 135, HDL 52, LDL 67 TSH 1.02  Physical Exam:    Today's Vitals   04/19/23 0828  BP: 128/78  Pulse: (!) 52  Resp: 16  SpO2: 94%  Weight: 231 lb 9.6 oz (  105.1 kg)  Height: 5\' 6"  (1.676 m)   Body mass index is 37.38 kg/m. Wt Readings from Last 3 Encounters:  04/19/23 231 lb 9.6 oz (105.1 kg)  03/25/23 235 lb (106.6 kg)  02/26/23 240 lb (108.9 kg)    Physical Exam  Constitutional: No distress.  Age appropriate, hemodynamically stable.   Neck: No JVD present.  Cardiovascular: Regular rhythm, S1 normal, S2 normal, intact distal pulses and normal pulses. Bradycardia present. Exam reveals no gallop, no S3 and no S4.  No murmur heard. Pulses:      Popliteal pulses are 2+ on the right side and 2+ on the left side.       Dorsalis pedis pulses are 2+ on the right side and 2+ on the left side.  Pulmonary/Chest: Effort normal and breath sounds normal. No stridor. She has no wheezes. She has no rales.  Abdominal: Soft. Bowel sounds are normal. She exhibits no distension. There is no abdominal tenderness.  Musculoskeletal:        General: No edema.     Cervical back: Neck supple.  Neurological: She is alert and oriented to person, place, and time. She has intact cranial nerves (2-12).  Skin: Skin is warm and moist.     Impression & Recommendation(s):  Impression:   ICD-10-CM   1. Persistent atrial fibrillation (HCC)  I48.19 EKG 12-Lead    2. Long term (current) use of anticoagulants  Z79.01     3. Sleep apnea in adult  G47.30     4. Coronary artery calcification seen on computed  tomography  I25.10     5. Benign hypertension  I10     6. Mixed hyperlipidemia  E78.2     7. Class 2 obesity due to excess calories without serious comorbidity with body mass index (BMI) of 37.0 to 37.9 in adult  E66.812    E66.09    Z68.37        Recommendation(s):  Persistent atrial fibrillation (HCC) Long term (current) use of anticoagulants History of direct-current cardioversion and atrial fibrillation ablation December 2024 EKG illustrates sinus bradycardia. Rate control: Lopressor 12.5 mg p.o. twice daily Rhythm control: N/A. Thromboembolic prophylaxis: Eliquis 5 mg p.o. twice daily. Prior antiarrhythmics used: Flecainide (failed) and amiodarone (leading up to  Afib ablation)   Sleep apnea in adult Has an upcoming appointment with Dr. Mayford Knife to establish care. Currently compliant with device therapy despite that has episodes of apnea  Coronary artery calcification seen on computed tomography Continue  statin therapy. CADS-RAD 1 based on last CCTA.  Denies anginal discomfort. Reemphasized importance of secondary prevention.  Benign hypertension Office blood pressures are well-controlled. Medications reconciled.  Mixed hyperlipidemia Was on atorvastatin in the past in the past. Seems to be on atorvastatin for now.  Recommend following up with PCP for lipid management.   Class 2 obesity due to excess calories without serious comorbidity with body mass index (BMI) of 37.0 to 37.9 in adult Body mass index is 37.38 kg/m. I reviewed with her importance of diet, regular physical activity/exercise, weight loss.   Patient is educated on the importance of increasing physical activity gradually as tolerated with a goal of moderate intensity exercise for 30 minutes a day 5 days a week.   Orders Placed:  Orders Placed This Encounter  Procedures   EKG 12-Lead   Final Medication List:   No orders of the defined types were placed in this encounter.   There are no  discontinued medications.   Current Outpatient  Medications:    acetaminophen (TYLENOL) 650 MG CR tablet, Take 1,300 mg by mouth every 8 (eight) hours as needed for pain., Disp: , Rfl:    apixaban (ELIQUIS) 5 MG TABS tablet, Take 1 tablet (5 mg total) by mouth 2 (two) times daily., Disp: 180 tablet, Rfl: 3   dicyclomine (BENTYL) 20 MG tablet, Take 20 mg by mouth 2 (two) times daily., Disp: , Rfl:    ezetimibe (ZETIA) 10 MG tablet, Take 10 mg by mouth at bedtime., Disp: , Rfl:    furosemide (LASIX) 20 MG tablet, Take 0.5 tablets (10 mg total) by mouth daily as needed (leg swelling)., Disp: 45 tablet, Rfl: 0   hydrochlorothiazide (HYDRODIURIL) 25 MG tablet, Take 1 tablet (25 mg total) by mouth in the morning., Disp: 90 tablet, Rfl: 1   lisinopril (ZESTRIL) 40 MG tablet, Take 1 tablet (40 mg total) by mouth daily., Disp: 90 tablet, Rfl: 1   Melatonin 10 MG CAPS, Take 10-20 mg by mouth at bedtime., Disp: , Rfl:    metoprolol tartrate (LOPRESSOR) 25 MG tablet, Take 12.5 mg by mouth 2 (two) times daily., Disp: , Rfl:    nitroGLYCERIN (NITROSTAT) 0.4 MG SL tablet, Place 0.4 mg under the tongue every 5 (five) minutes x 3 doses as needed for chest pain (if no relief after 2nd dose, proceed to ED or call 911)., Disp: , Rfl:    pantoprazole (PROTONIX) 40 MG tablet, Take 40 mg by mouth 2 (two) times daily., Disp: , Rfl:    PARoxetine (PAXIL) 30 MG tablet, Take 30 mg by mouth at bedtime., Disp: , Rfl:    Potassium 99 MG TABS, Take 99 mg by mouth at bedtime., Disp: , Rfl:    traZODone (DESYREL) 100 MG tablet, Take 100 mg by mouth at bedtime as needed for sleep., Disp: , Rfl:   Consent:   NA  Disposition:   1 year sooner if needed  Her questions and concerns were addressed to her satisfaction. She voices understanding of the recommendations provided during this encounter.    Signed, Tessa Lerner, DO, [facility]  [facility]  [address] [place], [place] [address] 04/19/2023 5:37 PM

## 2023-04-19 NOTE — Patient Instructions (Signed)

## 2023-04-22 ENCOUNTER — Ambulatory Visit (HOSPITAL_COMMUNITY)
Admission: RE | Admit: 2023-04-22 | Discharge: 2023-04-22 | Disposition: A | Discharge status: Home / Self Care | Payer: Medicare Other | Source: Ambulatory Visit | Attending: Physician Assistant | Admitting: Physician Assistant

## 2023-04-22 ENCOUNTER — Other Ambulatory Visit: Payer: Self-pay

## 2023-04-22 VITALS — BP 122/64 | HR 54 | Ht 66.0 in | Wt 237.0 lb

## 2023-04-22 DIAGNOSIS — I4819 Other persistent atrial fibrillation: Secondary | ICD-10-CM | POA: Insufficient documentation

## 2023-04-22 DIAGNOSIS — G4733 Obstructive sleep apnea (adult) (pediatric): Secondary | ICD-10-CM | POA: Insufficient documentation

## 2023-04-22 DIAGNOSIS — R9431 Abnormal electrocardiogram [ECG] [EKG]: Secondary | ICD-10-CM | POA: Insufficient documentation

## 2023-04-22 DIAGNOSIS — E669 Obesity, unspecified: Secondary | ICD-10-CM | POA: Insufficient documentation

## 2023-04-22 DIAGNOSIS — D6869 Other thrombophilia: Secondary | ICD-10-CM | POA: Diagnosis not present

## 2023-04-22 DIAGNOSIS — E785 Hyperlipidemia, unspecified: Secondary | ICD-10-CM | POA: Insufficient documentation

## 2023-04-22 DIAGNOSIS — I2510 Atherosclerotic heart disease of native coronary artery without angina pectoris: Secondary | ICD-10-CM | POA: Diagnosis present

## 2023-04-22 DIAGNOSIS — I10 Essential (primary) hypertension: Secondary | ICD-10-CM | POA: Insufficient documentation

## 2023-04-22 DIAGNOSIS — Z6838 Body mass index (BMI) 38.0-38.9, adult: Secondary | ICD-10-CM | POA: Insufficient documentation

## 2023-04-22 DIAGNOSIS — Z7901 Long term (current) use of anticoagulants: Secondary | ICD-10-CM | POA: Diagnosis not present

## 2023-04-22 DIAGNOSIS — I4891 Unspecified atrial fibrillation: Secondary | ICD-10-CM | POA: Diagnosis present

## 2023-04-22 MED ORDER — APIXABAN 5 MG PO TABS: 5.0000 mg | ORAL_TABLET | Freq: Two times a day (BID) | ORAL | 3 refills | Status: DC

## 2023-04-22 NOTE — Progress Notes (Signed)
 Primary Care Physician: Paola Greig CROME, FNP Primary Cardiologist: Madonna Large, DO Electrophysiologist: Eulas FORBES Furbish, MD  Referring Physician: Dr Furbish   Kimberly Lamb is a 73 y.o. female with a history of CAD, HTN, HLD, atrial fibrillation who presents for follow up in the [facility]. She has a history of atrial fibrillation managed with flecainide . She began to have breakthrough episodes of atrial fibrillation and was hospitalized in May 2024 with atrial fibrillation and rapid ventricular rate. Flecainide  was discontinued and amiodarone  started. Because she had history of torn rotator cuff during the DC cardioversion, we opted to discharge her in atrial fibrillation to continue her amiodarone  load. She later chemically converted. She was seen by Dr Furbish and underwent afib ablation on 03/25/23. Patient is on Eliquis  for a CHADS2VASC score of 4.  On follow up today, patient reports that she is feeling much better post ablation. Her energy level has greatly improved. She denies chest pain or groin issues.  Today, she denies symptoms of palpitations, chest pain, shortness of breath, orthopnea, PND, lower extremity edema, dizziness, presyncope, syncope, snoring, daytime somnolence, bleeding, or neurologic sequela. The patient is tolerating medications without difficulties and is otherwise without complaint today.    Atrial Fibrillation Risk Factors:  she does have symptoms or diagnosis of sleep apnea. she does not have a history of rheumatic fever.   Atrial Fibrillation Management history:  Previous antiarrhythmic drugs: flecainide , amiodarone   Previous cardioversions: 04/22/22 Previous ablations: 03/25/23 Anticoagulation history: Eliquis   ROS- All systems are reviewed and negative except as per the HPI above.  Past Medical History:  Diagnosis Date   Anxiety    Coronary artery calcification    Heart murmur    Hyperlipidemia    Hypertension     Nonobstructive atherosclerosis of coronary artery     Current Outpatient Medications  Medication Sig Dispense Refill   acetaminophen  (TYLENOL ) 650 MG CR tablet Take 1,300 mg by mouth as needed for pain.     apixaban  (ELIQUIS ) 5 MG TABS tablet Take 1 tablet (5 mg total) by mouth 2 (two) times daily. 180 tablet 3   dicyclomine (BENTYL) 20 MG tablet Take 20 mg by mouth 2 (two) times daily.     ezetimibe (ZETIA) 10 MG tablet Take 10 mg by mouth at bedtime.     furosemide  (LASIX ) 20 MG tablet Take 0.5 tablets (10 mg total) by mouth daily as needed (leg swelling). 45 tablet 0   hydrochlorothiazide  (HYDRODIURIL ) 25 MG tablet Take 1 tablet (25 mg total) by mouth in the morning. 90 tablet 1   lisinopril  (ZESTRIL ) 40 MG tablet Take 1 tablet (40 mg total) by mouth daily. 90 tablet 1   Melatonin 10 MG CAPS Take 10-20 mg by mouth at bedtime.     metoprolol  tartrate (LOPRESSOR ) 25 MG tablet Take 12.5 mg by mouth 2 (two) times daily.     nitroGLYCERIN  (NITROSTAT ) 0.4 MG SL tablet Place 0.4 mg under the tongue every 5 (five) minutes x 3 doses as needed for chest pain (if no relief after 2nd dose, proceed to ED or call 911).     pantoprazole  (PROTONIX ) 40 MG tablet Take 40 mg by mouth 2 (two) times daily.     PARoxetine  (PAXIL ) 30 MG tablet Take 30 mg by mouth at bedtime.     Potassium 99 MG TABS Take 99 mg by mouth at bedtime.     traZODone (DESYREL) 100 MG tablet Take 100 mg by mouth at bedtime as  needed for sleep.     No current facility-administered medications for this encounter.    Physical Exam: BP 122/64   Pulse (!) 54   Ht 5' 6 (1.676 m)   Wt 107.5 kg   BMI 38.25 kg/m   GEN: Well nourished, well developed in no acute distress NECK: No JVD; No carotid bruits CARDIAC: Regular rate and rhythm, no murmurs, rubs, gallops RESPIRATORY:  Clear to auscultation without rales, wheezing or rhonchi  ABDOMEN: Soft, non-tender, non-distended EXTREMITIES:  No edema; No deformity   Wt Readings from  Last 3 Encounters:  04/22/23 107.5 kg  04/19/23 105.1 kg  03/25/23 106.6 kg     EKG today demonstrates  SB Vent. rate 54 BPM PR interval 194 ms QRS duration 90 ms QT/QTcB 470/445 ms   TEE 04/22/22 demonstrated   1. Left ventricular ejection fraction, by estimation, is 50 to 55%. The  left ventricle has low normal function.   2. Right ventricular systolic function is normal. The right ventricular  size is normal.   3. Left atrial size was moderately dilated. No left atrial/left atrial  appendage thrombus was detected. The LAA emptying velocity was 33 cm/s.   4. The mitral valve is normal in structure. Mild mitral valve  regurgitation. No evidence of mitral stenosis.   5. Tricuspid valve regurgitation is moderate.   6. The aortic valve is tricuspid. Aortic valve regurgitation is not  visualized. No aortic stenosis is present.   7. Aortic Proximal ascending aorta within normal limits. There is mild  dilatation of the aortic root, measuring 39 mm. There is mild (Grade II)  layered plaque involving the descending aorta.   8. See images 59, 60, 97. Agitated saline contrast bubble study was  positive with shunting observed within 3-6 cardiac cycles suggestive of  interatrial shunt.    CHA2DS2-VASc Score = 4  The patient's score is based upon: CHF History: 0 HTN History: 1 Diabetes History: 0 Stroke History: 0 Vascular Disease History: 1 Age Score: 1 Gender Score: 1       ASSESSMENT AND PLAN: Persistent Atrial Fibrillation (ICD10:  I48.19) The patient's CHA2DS2-VASc score is 4, indicating a 4.8% annual risk of stroke.   Previously failed flecainide  S/p afib ablation 03/25/23, now off amiodarone   Patient appears to be maintaining SR Continue Eliquis  5 mg BID with no missed doses for 3 months post ablation Continue Lopressor  12.5 mg BID  Secondary Hypercoagulable State (ICD10:  D68.69) The patient is at significant risk for stroke/thromboembolism based upon her CHA2DS2-VASc  Score of 4.  Continue Apixaban  (Eliquis ).   CAD CAC score 248 No anginal symptoms Followed by Dr Michele  HTN Stable on current regimen  Obesity Body mass index is 38.25 kg/m.  Encouraged lifestyle modification  OSA  Encouraged nightly CPAP She has an appointment to establish care with Dr Shlomo on 04/27/23.    Follow up with Dr Shlomo and Dr Nancey as scheduled.        Daril Kicks PA-C [facility] [address] [place], [place] [address] [phone]

## 2023-04-26 ENCOUNTER — Telehealth: Payer: Self-pay | Admitting: *Deleted

## 2023-04-26 DIAGNOSIS — G4730 Sleep apnea, unspecified: Secondary | ICD-10-CM

## 2023-04-26 NOTE — Telephone Encounter (Addendum)
 DME selection is [facility], [place]  Patient understands he will be contacted by [facility] to set up his cpap. Patient understands to call if [facility] does not contact him with new setup in a timely manner. Patient understands they will be called once confirmation has been received from [facility] that they have received their new machine to schedule 10 week follow up appointment. [facility] notified of new cpap order  Please add to airview Patient was grateful for the call and thanked me

## 2023-04-27 ENCOUNTER — Encounter: Payer: Self-pay | Admitting: Cardiology

## 2023-04-27 ENCOUNTER — Ambulatory Visit: Payer: Medicare Other | Attending: Cardiology | Admitting: Cardiology

## 2023-04-27 VITALS — BP 148/78 | HR 55 | Ht 66.0 in | Wt 234.0 lb

## 2023-04-27 DIAGNOSIS — I10 Essential (primary) hypertension: Secondary | ICD-10-CM | POA: Diagnosis not present

## 2023-04-27 DIAGNOSIS — G4733 Obstructive sleep apnea (adult) (pediatric): Secondary | ICD-10-CM | POA: Insufficient documentation

## 2023-04-27 NOTE — Addendum Note (Signed)
 Addended by: Reesa Chew on: 04/27/2023 01:24 PM   Modules accepted: Orders

## 2023-04-27 NOTE — Telephone Encounter (Addendum)
 Kimberly Reichert, MD  Reesa Chew, CMA Please order a Nasal cushion mask with chin strap - will need to be fitted by [facility]   -I will order a chin strap   Order placed to Carolan Shiver ,[place]

## 2023-04-27 NOTE — Patient Instructions (Signed)
Medication Instructions:  Your physician recommends that you continue on your current medications as directed. Please refer to the Current Medication list given to you today.  *If you need a refill on your cardiac medications before your next appointment, please call your pharmacy*   Lab Work: None.  If you have labs (blood work) drawn today and your tests are completely normal, you will receive your results only by: MyChart Message (if you have MyChart) OR A paper copy in the mail If you have any lab test that is abnormal or we need to change your treatment, we will call you to review the results.   Testing/Procedures: None.   Follow-Up: At [facility], you and your health needs are our priority.  As part of our continuing mission to provide you with exceptional heart care, we have created designated Provider Care Teams.  These Care Teams include your primary Cardiologist (physician) and Advanced Practice Providers (APPs -  Physician Assistants and Nurse Practitioners) who all work together to provide you with the care you need, when you need it.  We recommend signing up for the patient portal called "MyChart".  Sign up information is provided on this After Visit Summary.  MyChart is used to connect with patients for Virtual Visits (Telemedicine).  Patients are able to view lab/test results, encounter notes, upcoming appointments, etc.  Non-urgent messages can be sent to your provider as well.   To learn more about what you can do with MyChart, go to [email or web address].    Your next appointment:   3 month(s)  Provider:   Dr. Traci Turner, MD   

## 2023-04-27 NOTE — Progress Notes (Signed)
 Sleep Medicine CONSULT Note    Date:  04/27/2023   ID:  Kimberly Lamb, DOB 1950/05/20, MRN [id number]  PCP:  Paola Greig CROME, FNP  Cardiologist: Madonna Large, DO   No chief complaint on file.   History of Present Illness:  Kimberly Lamb is a 73 y.o. female who is being seen today for the evaluation of OSA at the request of Agustus Mealor, MD.  This is a 73 year old female with a history of coronary artery calcifications, hyperlipidemia, hypertension and atrial fibrillation. She also has a history of sleep apnea and has been on CPAP therapy.  Apparently was being followed with [facility] physicians sleep clinic but was very frustrated with them as she felt like she was having issues that were not being addressed.  She is now referred for sleep medicine  to establish sleep care and treatment of obstructive sleep apnea.  She had a sleep study done 3 years ago and was started on CPAP.  She uses a under the nose FFM but does not like it because it makes her breath through her mouth and she normally breaths through her nose.  When she wakes up in the morning her mask is on the side of her face.  She asked for a nasal mask but [facility] told her they did not have any.  She tried the [facility] that came over the bridge of her nose but it would leave marks on her face.  She feels that she is having a lot of events when she follows her app.  She feels a lot of mouth dryness when she gets up in th am.    Past Medical History:  Diagnosis Date   Anxiety    Arrhythmia    Coronary artery calcification    Heart murmur    Hyperlipidemia    Hypertension    Nonobstructive atherosclerosis of coronary artery    OSA on CPAP     Past Surgical History:  Procedure Laterality Date   ACHILLES TENDON SURGERY  2010   ATRIAL FIBRILLATION ABLATION N/A 03/25/2023   Procedure: ATRIAL FIBRILLATION ABLATION;  Surgeon: Nancey Eulas BRAVO, MD;  [facility] IN[facility];  Service: Cardiovascular;  Laterality: N/A;   BUBBLE  STUDY  04/22/2022   Procedure: BUBBLE STUDY;  Surgeon: Large Madonna, DO;  Location: [facility] ENDOSCOPY;  Service: Cardiovascular;;   CARDIOVERSION N/A 04/22/2022   Procedure: CARDIOVERSION;  Surgeon: Large Madonna, DO;  Location: [facility]OS[facility];  Service: Cardiovascular;  Laterality: N/A;   knee replacement Right 2009   x3 2009, 2012, 2014   ROTATOR CUFF REPAIR  2011   Right   SPINAL FUSION     l3   TEE WITHOUT CARDIOVERSION N/A 04/22/2022   Procedure: TRANSESOPHAGEAL ECHOCARDIOGRAM (TEE);  Surgeon: Large Madonna, DO;  Location: [facility] ENDOS[facility];  Service: Cardiovascular;  Laterality: N/A;   VAGINAL HYSTERECTOMY  1986    Current Medications: Current Meds  Medication Sig   acetaminophen  (TYLENOL ) 650 MG CR tablet Take 1,300 mg by mouth as needed for pain.   apixaban  (ELIQUIS ) 5 MG TABS tablet Take 1 tablet (5 mg total) by mouth 2 (two) times daily.   dicyclomine (BENTYL) 20 MG tablet Take 20 mg by mouth 2 (two) times daily.   ezetimibe (ZETIA) 10 MG tablet Take 10 mg by mouth at bedtime.   furosemide  (LASIX ) 20 MG tablet Take 0.5 tablets (10 mg total) by mouth daily as needed (leg swelling).   hydrochlorothiazide  (HYDRODIURIL ) 25 MG tablet Take 1 tablet (25  mg total) by mouth in the morning.   lisinopril  (ZESTRIL ) 40 MG tablet Take 1 tablet (40 mg total) by mouth daily.   Melatonin 10 MG CAPS Take 10-20 mg by mouth at bedtime.   metoprolol  tartrate (LOPRESSOR ) 25 MG tablet Take 12.5 mg by mouth 2 (two) times daily.   nitroGLYCERIN  (NITROSTAT ) 0.4 MG SL tablet Place 0.4 mg under the tongue every 5 (five) minutes x 3 doses as needed for chest pain (if no relief after 2nd dose, proceed to ED or call 911).   pantoprazole  (PROTONIX ) 40 MG tablet Take 40 mg by mouth 2 (two) times daily.   PARoxetine  (PAXIL ) 30 MG tablet Take 30 mg by mouth at bedtime.   Potassium 99 MG TABS Take 99 mg by mouth at bedtime.   traZODone (DESYREL) 100 MG tablet Take 100 mg by mouth at bedtime as needed for sleep.    Allergies:    Patient has no known allergies.   Social History   Socioeconomic History   Marital status: Widowed    Spouse name: Not on file   Number of children: 2   Years of education: Not on file   Highest education level: Not on file  Occupational History   Not on file  Tobacco Use   Smoking status: Never   Smokeless tobacco: Never  Vaping Use   Vaping status: Never Used  Substance and Sexual Activity   Alcohol use: Never   Drug use: Never   Sexual activity: Not Currently  Other Topics Concern   Not on file  Social History Narrative   Not on file   Social Drivers of Health   Financial Resource Strain: Not on file  Food Insecurity: Not on file  Transportation Needs: Not on file  Physical Activity: Not on file  Stress: Not on file  Social Connections: Not on file     Family History:  The patient's family history includes Heart attack in her father; Heart failure in her father.   ROS:   Please see the history of present illness.    ROS All other systems reviewed and are negative.      No data to display             PHYSICAL EXAM:   VS:  BP (!) 148/78   Pulse (!) 55   Ht 5' 6 (1.676 m)   Wt 234 lb (106.1 kg)   SpO2 98%   BMI 37.77 kg/m    GEN: Well nourished, well developed, in no acute distress  HEENT: normal  Neck: no JVD, carotid bruits, or masses Cardiac: RRR; no murmurs, rubs, or gallops,no edema.  Intact distal pulses bilaterally.  Respiratory:  clear to auscultation bilaterally, normal work of breathing GI: soft, nontender, nondistended, + BS MS: no deformity or atrophy  Skin: warm and dry, no rash Neuro:  Alert and Oriented x 3, Strength and sensation are intact Psych: euthymic mood, full affect  Wt Readings from Last 3 Encounters:  04/27/23 234 lb (106.1 kg)  04/22/23 237 lb (107.5 kg)  04/19/23 231 lb 9.6 oz (105.1 kg)      Studies/Labs Reviewed:   none  Recent Labs: 08/19/2022: B Natriuretic Peptide 209.2; TSH 1.543 08/21/2022: ALT  23 08/22/2022: Magnesium 2.0 02/25/2023: BUN 21; Creatinine, Ser 0.82; Hemoglobin 11.9; Platelets 272; Potassium 4.4; Sodium 142    CHA2DS2-VASc Score = 4   This indicates a 4.8% annual risk of stroke. The patient's score is based upon: CHF History: 0 HTN History:  1 Diabetes History: 0 Stroke History: 0 Vascular Disease History: 1 Age Score: 1 Gender Score: 1  ASSESSMENT:    1. OSA (obstructive sleep apnea)   2. Benign hypertension      PLAN:  In order of problems listed above:  OSA  -The patient was diagnosed in the past with sleep apnea and has been on CPAP therapy but has been struggling with it -she does not tolerate the under the nose FFM and would like to try a nasal mask as she is not a mouth breather -I will order a chin strap to use as well -will get a download on her device to see if we need to make any changes  Hypertension -BP borderline controlled on exam today -Continue prescription drug management with HCTZ 25 mg daily, Zestril  40 mg daily, Lopressor  12.5 mg twice daily with as needed refills  Followup with me in 3 months   Time Spent: 20 minutes total time of encounter, including 15 minutes spent in face-to-face patient care on the date of this encounter. This time includes coordination of care and counseling regarding above mentioned problem list. Remainder of non-face-to-face time involved reviewing chart documents/testing relevant to the patient encounter and documentation in the medical record. I have independently reviewed documentation from referring provider  Medication Adjustments/Labs and Tests Ordered: Current medicines are reviewed at length with the patient today.  Concerns regarding medicines are outlined above.  Medication changes, Labs and Tests ordered today are listed in the Patient Instructions below.  There are no Patient Instructions on file for this visit.   Signed, Wilbert Bihari, MD  04/27/2023 9:05 AM    [facility]  [facility] [address] [place], [place], [place]  [address] Phone: [phone]; Fax: [phone]

## 2023-05-10 ENCOUNTER — Encounter: Payer: Self-pay | Admitting: Cardiology

## 2023-06-12 ENCOUNTER — Other Ambulatory Visit: Payer: Self-pay | Admitting: Cardiology

## 2023-06-14 ENCOUNTER — Encounter: Payer: Self-pay | Admitting: *Deleted

## 2023-06-25 ENCOUNTER — Ambulatory Visit
Payer: No Typology Code available for payment source | Attending: Cardiovascular Disease | Admitting: Cardiovascular Disease

## 2023-06-25 ENCOUNTER — Encounter: Payer: Self-pay | Admitting: Cardiovascular Disease

## 2023-06-25 VITALS — BP 144/82 | HR 64 | Ht 66.0 in | Wt 222.6 lb

## 2023-06-25 DIAGNOSIS — I4819 Other persistent atrial fibrillation: Secondary | ICD-10-CM | POA: Insufficient documentation

## 2023-06-25 NOTE — Progress Notes (Signed)
 Electrophysiology Office Note:    Date:  06/25/2023   ID:  Kimberly Lamb, DOB 1950/07/01, MRN [id number]  PCP:  Vanessa [place], FNP   [facility] Providers Cardiologist:  Tessa Lerner, DO Electrophysiologist:  Maurice Small, MD     Referring MD: Vanessa [place], FNP   History of Present Illness:    Kimberly Lamb is a 73 y.o. female with a hx listed below, significant for nonobstructive coronary disease, hypertension, hyperlipidemia, paroxysmal atrial fibrillation who presents for hospital follow-up.  She has a history of atrial fibrillation managed with flecainide.  She began to have breakthrough episodes of atrial fibrillation and was hospitalized in May with atrial fibrillation and rapid ventricular rate.  Flecainide was discontinued and amiodarone started.  Because she had history of torn rotator cuff during the DC cardioversion, we opted to discharge her in atrial fibrillation to continue her amiodarone load.  She has since converted to sinus rhythm and feels better.  She presents today for follow-up prior to atrial fibrillation ablation which scheduled for December 5.  She reports that she has not had any changes in her condition, diagnoses, or medications.  She experiences A-fib symptoms for a little while at least daily.  She is now under the care of Dr. Mayford Knife for sleep apnea.  She will underwent A-fib ablation with PFA December 2024.  She monitors her rhythm with an Apple watch.  She has not had any symptoms or alerts from her Apple Watch to suggest recurrence of atrial fibrillation.  She is very eager to discontinue apixaban.   EKGs/Labs/Other Studies Reviewed Today:    Echocardiogram:  TEE 04/2022 EF 50-55%, LA moderately dilated   Monitors:   Stress testing:   Advanced imaging:   Cardiac catherization   EKG:  Last EKG results: today sinus bradycardia, 57 bpm   Recent Labs: 08/19/2022: B Natriuretic Peptide 209.2; TSH 1.543 08/21/2022: ALT  23 08/22/2022: Magnesium 2.0 02/25/2023: BUN 21; Creatinine, Ser 0.82; Hemoglobin 11.9; Platelets 272; Potassium 4.4; Sodium 142     Physical Exam:    VS:  BP (!) 144/82   Pulse 64   Ht 5\' 6"  (1.676 m)   Wt 222 lb 9.6 oz (101 kg)   SpO2 95%   BMI 35.93 kg/m     Wt Readings from Last 3 Encounters:  06/25/23 222 lb 9.6 oz (101 kg)  04/27/23 234 lb (106.1 kg)  04/22/23 237 lb (107.5 kg)     GEN: Well nourished, well developed in no acute distress CARDIAC: RRR, no murmurs, rubs, gallops RESPIRATORY:  Normal work of breathing MUSCULOSKELETAL: no edema    ASSESSMENT & PLAN:    Persistent atrial fibrillation Failed flecainide Symptomatic S/p AF ablation 03/25/2023 She has been monitoring with her Apple Watch and has not had A-fib alerts nor symptoms of recurrence Though CHA2DS2-VASc score is 5, we may consider discontinuing Eliquis if she remains completely free of A-fib with ongoing monitoring.  Obstructive Sleep apnea I commended her on her compliance with CPAP She requests to transfer to [facility] for OSA management  Obesity We discussed the importance of weight loss for maintenance of sinus rhythm and to improve the safety of the procedure.  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 Will evaluate with EP study during AF ablation.  PFO Noted on CT and TEE          Medication Adjustments/Labs and Tests Ordered: Current medicines are reviewed  at length with the patient today.  Concerns regarding medicines are outlined above.  Orders Placed This Encounter  Procedures   EKG 12-Lead   No orders of the defined types were placed in this encounter.    Signed, Maurice Small, MD  06/25/2023 3:10 PM    [facility]

## 2023-06-25 NOTE — Patient Instructions (Addendum)
 Medication Instructions:  Continue all current medications.  Labwork: none  Testing/Procedures: none  Follow-Up: 3 months   Any Other Special Instructions Will Be Listed Below (If Applicable).  If you need a refill on your cardiac medications before your next appointment, please call your pharmacy.

## 2023-07-26 ENCOUNTER — Encounter: Payer: Self-pay | Admitting: Cardiology

## 2023-07-26 ENCOUNTER — Ambulatory Visit: Payer: No Typology Code available for payment source | Attending: Cardiology | Admitting: Cardiology

## 2023-07-26 VITALS — BP 128/78 | HR 62 | Ht 66.5 in | Wt 231.0 lb

## 2023-07-26 DIAGNOSIS — G4733 Obstructive sleep apnea (adult) (pediatric): Secondary | ICD-10-CM | POA: Diagnosis present

## 2023-07-26 DIAGNOSIS — I10 Essential (primary) hypertension: Secondary | ICD-10-CM

## 2023-07-26 NOTE — Progress Notes (Signed)
 Sleep Medicine Note    Date:  07/26/2023   ID:  Kimberly Lamb, DOB 1950/09/20, MRN [id number]  PCP:  Vanessa [employer], FNP  Cardiologist: Tessa Lerner, DO   Chief Complaint  Patient presents with   Sleep Apnea   Hypertension    History of Present Illness:  Kimberly Lamb is a 73 y.o. female with a history of coronary artery calcifications, hyperlipidemia, hypertension and atrial fibrillation. She also has a history of sleep apnea and has been on CPAP therapy.  Apparently was being followed with [facility] physicians sleep clinic but was very frustrated with them as she felt like she was having issues that were not being addressed.  She was referred here for sleep medicine  to establish sleep care and treatment of obstructive sleep apnea.  She had a sleep study done 3 years ago and was started on CPAP.  She had been using an  under the nose FFM but did not like it because it madeher breathe through her mouth and she normally breaths through her nose.  When she would wake up in the morning her mask was on the side of her face.  She asked for a nasal mask but [facility] told her they did not have any.  She tried the [facility] that came over the bridge of her nose but it would leave marks on her face.  At her office visit 04/2023 I changed her to a nasal mask with a chinstrap.  She is now back for follow-up to see how she is doing  Unfortunately she never got the nasal mask as there was a problem with the order.  She finally went in to the DME and they gave her a nasal pillow mask but could not get it to fit.  It would stretch out and then not fit.  She went back to the [facility] and they said the still did not have an order for the nasal mask.  She then got a nasal cushion mask but says that it is very flimsy and cannot get a good seal.    Past Medical History:  Diagnosis Date   Anxiety    Arrhythmia    Coronary artery calcification    Heart murmur    Hyperlipidemia    Hypertension    Nonobstructive  atherosclerosis of coronary artery    OSA on CPAP     Past Surgical History:  Procedure Laterality Date   ACHILLES TENDON SURGERY  2010   ATRIAL FIBRILLATION ABLATION N/A 03/25/2023   Procedure: ATRIAL FIBRILLATION ABLATION;  Surgeon: Maurice Small, MD;  [facility];  Service: Cardiovascular;  Laterality: N/A;   BUBBLE STUDY  04/22/2022   Procedure: BUBBLE STUDY;  Surgeon: Tessa Lerner, DO;  Location: [facility] ENDOS[facility];  Service: Cardiovascular;;   CARDIOVERSION N/A 04/22/2022   Procedure: CARDIOVERSION;  Surgeon: Tessa Lerner, DO;  [facility]: [facility];  Service: Cardiovascular;  Laterality: N/A;   knee replacement Right 2009   x3 2009, 2012, 2014   ROTATOR CUFF REPAIR  2011   Right   SPINAL FUSION     l3   TEE WITHOUT CARDIOVERSION N/A 04/22/2022   Procedure: TRANSESOPHAGEAL ECHOCARDIOGRAM (TEE);  Surgeon: Tessa Lerner, DO;  Location: [facility];  Service: Cardiovascular;  Laterality: N/A;   VAGINAL HYSTERECTOMY  1986    Current Medications: Current Meds  Medication Sig   acetaminophen (TYLENOL) 650 MG CR tablet Take 1,300 mg by mouth as needed for pain.   allopurinol (ZYLOPRIM) 100  MG tablet Take 100 mg by mouth daily.   apixaban (ELIQUIS) 5 MG TABS tablet Take 1 tablet (5 mg total) by mouth 2 (two) times daily.   dicyclomine (BENTYL) 20 MG tablet Take 20 mg by mouth 2 (two) times daily.   ezetimibe (ZETIA) 10 MG tablet Take 10 mg by mouth at bedtime.   ferrous sulfate 325 (65 FE) MG tablet Take 325 mg by mouth daily.   furosemide (LASIX) 20 MG tablet Take 0.5 tablets (10 mg total) by mouth daily as needed (leg swelling).   hydrochlorothiazide (HYDRODIURIL) 25 MG tablet Take 1 tablet (25 mg total) by mouth in the morning.   lisinopril (ZESTRIL) 40 MG tablet Take 1 tablet (40 mg total) by mouth daily.   Melatonin 10 MG CAPS Take 10-20 mg by mouth at bedtime.   metoprolol tartrate (LOPRESSOR) 25 MG tablet Take 12.5 mg by mouth 2 (two) times daily.   nitroGLYCERIN  (NITROSTAT) 0.4 MG SL tablet Place 0.4 mg under the tongue every 5 (five) minutes x 3 doses as needed for chest pain (if no relief after 2nd dose, proceed to ED or call 911).   pantoprazole (PROTONIX) 40 MG tablet Take 40 mg by mouth 2 (two) times daily.   PARoxetine (PAXIL) 30 MG tablet Take 30 mg by mouth at bedtime.   Potassium 99 MG TABS Take 99 mg by mouth at bedtime.   traZODone (DESYREL) 100 MG tablet Take 50 mg by mouth at bedtime as needed for sleep.    Allergies:   Patient has no known allergies.   Social History   Socioeconomic History   Marital status: Widowed    Spouse name: Not on file   Number of children: 2   Years of education: Not on file   Highest education level: Not on file  Occupational History   Not on file  Tobacco Use   Smoking status: Never   Smokeless tobacco: Never  Vaping Use   Vaping status: Never Used  Substance and Sexual Activity   Alcohol use: Never   Drug use: Never   Sexual activity: Not Currently  Other Topics Concern   Not on file  Social History Narrative   Not on file   Social Drivers of Health   Financial Resource Strain: Not on file  Food Insecurity: Not on file  Transportation Needs: Not on file  Physical Activity: Not on file  Stress: Not on file  Social Connections: Not on file     Family History:  The patient's family history includes Heart attack in her father; Heart failure in her father.   ROS:   Please see the history of present illness.    ROS All other systems reviewed and are negative.      No data to display             PHYSICAL EXAM:   VS:  BP 128/78   Pulse 62   Ht 5' 6.5" (1.689 m)   Wt 231 lb (104.8 kg)   SpO2 92%   BMI 36.73 kg/m    GEN: Well nourished, well developed in no acute distress HEENT: Normal NECK: No JVD; No carotid bruits LYMPHATICS: No lymphadenopathy CARDIAC:RRR, no murmurs, rubs, gallops RESPIRATORY:  Clear to auscultation without rales, wheezing or rhonchi  ABDOMEN:  Soft, non-tender, non-distended MUSCULOSKELETAL:  No edema; No deformity  SKIN: Warm and dry NEUROLOGIC:  Alert and oriented x 3 PSYCHIATRIC:  Normal affect  Wt Readings from Last 3 Encounters:  07/26/23 231  lb (104.8 kg)  06/25/23 222 lb 9.6 oz (101 kg)  04/27/23 234 lb (106.1 kg)      Studies/Labs Reviewed:   none  Recent Labs: 08/19/2022: B Natriuretic Peptide 209.2; TSH 1.543 08/21/2022: ALT 23 08/22/2022: Magnesium 2.0 02/25/2023: BUN 21; Creatinine, Ser 0.82; Hemoglobin 11.9; Platelets 272; Potassium 4.4; Sodium 142    CHA2DS2-VASc Score = 4   This indicates a 4.8% annual risk of stroke. The patient's score is based upon: CHF History: 0 HTN History: 1 Diabetes History: 0 Stroke History: 0 Vascular Disease History: 1 Age Score: 1 Gender Score: 1  ASSESSMENT:    1. OSA (obstructive sleep apnea)   2. Benign hypertension       PLAN:  In order of problems listed above:  OSA - The patient is tolerating PAP therapy well without any problems. The PAP download performed by his DME was personally reviewed and interpreted by me today and showed an AHI of 7.5 /hr on auto CPAP from 5-20 cm H2O with 87% compliance in using more than 4 hours nightly.  The patient has been using and benefiting from PAP use and will continue to benefit from therapy.  -she is still struggling with finding a mask that works for her and there have been issues with [facility] in [place] in getting the correct mask that I ordered -I will have my sleep team call [facility] in [place] and tell them that she needs the [employer]Med Nasal mask and nothing else!  If she cannot get the mask that we ordered then we will have to transfer her DME to [facility]  Hypertension -BP is controlled on exam today -Continue prescription drug management with HCTZ 25 mg daily, Zestril 40 mg daily and Lopressor 12.5 mg twice daily with as needed refills  Followup with me in 3 months   Time Spent: 20 minutes total time of encounter,  including 15 minutes spent in face-to-face patient care on the date of this encounter. This time includes coordination of care and counseling regarding above mentioned problem list. Remainder of non-face-to-face time involved reviewing chart documents/testing relevant to the patient encounter and documentation in the medical record. I have independently reviewed documentation from referring provider  Medication Adjustments/Labs and Tests Ordered: Current medicines are reviewed at length with the patient today.  Concerns regarding medicines are outlined above.  Medication changes, Labs and Tests ordered today are listed in the Patient Instructions below.  There are no Patient Instructions on file for this visit.   Signed, Armanda Magic, MD  07/26/2023 3:39 PM    [facility] [address] [place], [place], [place]  [address] Phone: [phone]; Fax: [phone]

## 2023-07-26 NOTE — Patient Instructions (Addendum)
 Medication Instructions:  Your physician recommends that you continue on your current medications as directed. Please refer to the Current Medication list given to you today.  *If you need a refill on your cardiac medications before your next appointment, please call your pharmacy*  Lab Work: NONE If you have labs (blood work) drawn today and your tests are completely normal, you will receive your results only by: MyChart Message (if you have MyChart) OR A paper copy in the mail If you have any lab test that is abnormal or we need to change your treatment, we will call you to review the results.  Testing/Procedures: NONE  Follow-Up: At [facility], you and your health needs are our priority.  As part of our continuing mission to provide you with exceptional heart care, our providers are all part of one team.  This team includes your primary Cardiologist (physician) and Advanced Practice Providers or APPs (Physician Assistants and Nurse Practitioners) who all work together to provide you with the care you need, when you need it.  Your next appointment:   3 months  Provider:   Mayford Knife, MD  We recommend signing up for the patient portal called "MyChart".  Sign up information is provided on this After Visit Summary.  MyChart is used to connect with patients for Virtual Visits (Telemedicine).  Patients are able to view lab/test results, encounter notes, upcoming appointments, etc.  Non-urgent messages can be sent to your provider as well.   To learn more about what you can do with MyChart, go to [email or web address].   Other Instructions      1st Floor: - Lobby - Registration  - Pharmacy  - Lab - Cafe  2nd Floor: - PV Lab - Diagnostic Testing (echo, CT, nuclear med)  3rd Floor: - Vacant  4th Floor: - TCTS (cardiothoracic surgery) - AFib Clinic - Structural Heart Clinic - Vascular Surgery  - Vascular Ultrasound  5th Floor: - HeartCare Cardiology (general and  EP) - Clinical Pharmacy for coumadin, hypertension, lipid, weight-loss medications, and med management appointments    Valet parking services will be available as well.

## 2023-07-28 ENCOUNTER — Other Ambulatory Visit: Payer: Self-pay

## 2023-07-28 DIAGNOSIS — I4819 Other persistent atrial fibrillation: Secondary | ICD-10-CM

## 2023-07-28 DIAGNOSIS — I2510 Atherosclerotic heart disease of native coronary artery without angina pectoris: Secondary | ICD-10-CM

## 2023-07-28 DIAGNOSIS — I10 Essential (primary) hypertension: Secondary | ICD-10-CM

## 2023-07-28 DIAGNOSIS — I480 Paroxysmal atrial fibrillation: Secondary | ICD-10-CM

## 2023-07-28 DIAGNOSIS — G4733 Obstructive sleep apnea (adult) (pediatric): Secondary | ICD-10-CM

## 2023-07-28 DIAGNOSIS — I471 Supraventricular tachycardia, unspecified: Secondary | ICD-10-CM

## 2023-07-28 NOTE — Progress Notes (Signed)
 Order for ResMed Nasal mask sent to [facility] in [place] today.

## 2023-08-05 ENCOUNTER — Telehealth: Payer: Self-pay | Admitting: Cardiology

## 2023-08-05 NOTE — Telephone Encounter (Signed)
 Pt calling in for an update on her new Nasal Mask being sent to [facility] in [place]. She states she hasn't heard anything from them yet. Please advise.

## 2023-08-06 NOTE — Telephone Encounter (Signed)
 Notified patient that order for new nasal mask has been faxed to [facility] in [place] for a second time. Patient will call sleep coordinator back today if they have not heard from [facility].

## 2023-08-25 ENCOUNTER — Telehealth: Payer: Self-pay | Admitting: Cardiology

## 2023-08-25 ENCOUNTER — Other Ambulatory Visit: Payer: Self-pay | Admitting: *Deleted

## 2023-08-25 DIAGNOSIS — I4819 Other persistent atrial fibrillation: Secondary | ICD-10-CM

## 2023-08-25 MED ORDER — APIXABAN 5 MG PO TABS: 5.0000 mg | ORAL_TABLET | Freq: Two times a day (BID) | ORAL | 5 refills | Status: DC

## 2023-08-25 NOTE — Telephone Encounter (Signed)
*  STAT* If patient is at the pharmacy, call can be transferred to refill team.   1. Which medications need to be refilled? (please list name of each medication and dose if known)   apixaban  (ELIQUIS ) 5 MG TABS tablet   2. Would you like to learn more about the convenience, safety, & potential cost savings by using the [facility]?   3. Are you open to using the [facility] (Type [facility]. ).  4. Which pharmacy/location (including street and city if local pharmacy) is medication to be sent to?  [id number] [place], [place] - [address]   5. Do they need a 30 day or 90 day supply?   30 day  Patient stated she still has some medication.

## 2023-08-25 NOTE — Telephone Encounter (Signed)
 Eliquis  5mg  refill request received. Patient is 73 years old, weight-104.8kg, Crea-0.82 on 02/25/23, Diagnosis-Afib, and last seen by Dr. Micael Adas on 07/26/23. Dose is appropriate based on dosing criteria. Will send in refill to requested pharmacy.

## 2023-09-03 ENCOUNTER — Other Ambulatory Visit: Payer: Self-pay | Admitting: Cardiology

## 2023-09-03 DIAGNOSIS — I4819 Other persistent atrial fibrillation: Secondary | ICD-10-CM

## 2023-09-03 NOTE — Telephone Encounter (Signed)
Refill has been sent to requested pharmacy 

## 2023-09-03 NOTE — Telephone Encounter (Signed)
 Prescription refill request for Eliquis  received. Indication: Afib  Last office visit: 07/26/23 Micael Adas)  Scr: 0.82 (02/25/23)  Age: 73 Weight: 104.8kg  Appropriate dose. Refill sent.

## 2023-09-03 NOTE — Telephone Encounter (Signed)
*  STAT* If patient is at the pharmacy, call can be transferred to refill team.   1. Which medications need to be refilled? (please list name of each medication and dose if known)   apixaban  (ELIQUIS ) 5 MG TABS tablet   2. Would you like to learn more about the convenience, safety, & potential cost savings by using the [facility]?   3. Are you open to using the [facility] (Type [facility]. ).  4. Which pharmacy/location (including street and city if local pharmacy) is medication to be sent to?  [id number] [place], [place] - [address]   5. Do they need a 30 day or 90 day supply?   30 day  Daughter Sherrlyn Dolores) stated patient is completely out of this medication. Daughter stated patient's pharmacy has not received the refill and want the prescription re-sent.

## 2023-09-16 ENCOUNTER — Telehealth: Payer: Self-pay | Admitting: Cardiovascular Disease

## 2023-09-16 NOTE — Telephone Encounter (Signed)
   Pre-operative Risk Assessment    Patient Name: Kimberly Lamb  DOB: 21-Aug-1950 MRN: [id number]      Request for Surgical Clearance    Procedure:  Phaco w/PC IOL  Date of Surgery:  Clearance 10/01/23                                 Surgeon:  Dr. Bland Bunnell Surgeon's Group or Practice Name:  [facility] Phone number:  [phone] Fax number:  [phone]   Type of Clearance Requested:   - Medical  - Pharmacy:  Hold Apixaban  (Eliquis ) 3-5 days before surgery   Type of Anesthesia:  Local  with light sedation   Additional requests/questions:  Please fax a copy of clearance and most recent office notes to the surgeon's office.  Signed, Georgia Kipper   09/16/2023, 3:00 PM

## 2023-09-20 NOTE — Telephone Encounter (Signed)
 Patient with diagnosis of atrial fibrillation on Eliquis  for anticoagulation.    Procedure:  Phaco w/PC IOL   Date of Surgery:  Clearance 10/01/23    CHA2DS2-VASc Score = 4   This indicates a 4.8% annual risk of stroke. The patient's score is based upon: CHF History: 0 HTN History: 1 Diabetes History: 0 Stroke History: 0 Vascular Disease History: 1 Age Score: 1 Gender Score: 1    CrCl 98 Platelet count 272  Patient has not had an Afib/aflutter ablation within the last 3 months or DCCV within the last 30 days (Ablation 03/2023)  Per office protocol, patient can hold Eliquis  for 3 days prior to procedure.   Patient will not need bridging with Lovenox (enoxaparin) around procedure.  **This guidance is not considered finalized until pre-operative APP has relayed final recommendations.**

## 2023-09-20 NOTE — Telephone Encounter (Signed)
   Patient Name: Kimberly Lamb  DOB: May 20, 1950 MRN: [id number]  Primary Cardiologist: Olinda Bertrand, DO  Chart reviewed as part of pre-operative protocol coverage. Cataract extractions are recognized in guidelines as low risk surgeries that do not typically require specific preoperative testing or holding of blood thinner therapy. Therefore, given past medical history and time since last visit, based on ACC/AHA guidelines, Gerda Yin would be at acceptable risk for the planned procedure without further cardiovascular testing.   Per office protocol, patient can hold Eliquis  for 3 days prior to procedure.   Patient will not need bridging with Lovenox (enoxaparin) around procedure.  I will route this recommendation to the requesting party via Epic fax function and remove from pre-op  pool.  Please call with questions.  Odai Wimmer D Loralai Eisman, NP 09/20/2023, 12:01 PM

## 2023-09-24 ENCOUNTER — Encounter: Payer: Self-pay | Admitting: Cardiovascular Disease

## 2023-09-24 ENCOUNTER — Ambulatory Visit: Attending: Cardiovascular Disease | Admitting: Cardiovascular Disease

## 2023-09-24 VITALS — BP 134/82 | HR 63 | Ht 78.5 in | Wt 229.2 lb

## 2023-09-24 DIAGNOSIS — I4819 Other persistent atrial fibrillation: Secondary | ICD-10-CM | POA: Insufficient documentation

## 2023-09-24 NOTE — Patient Instructions (Signed)
 Medication Instructions:  Your physician recommends that you continue on your current medications as directed. Please refer to the Current Medication list given to you today.  *If you need a refill on your cardiac medications before your next appointment, please call your pharmacy*  Lab Work: None ordered.  If you have labs (blood work) drawn today and your tests are completely normal, you will receive your results only by: MyChart Message (if you have MyChart) OR A paper copy in the mail If you have any lab test that is abnormal or we need to change your treatment, we will call you to review the results.  Testing/Procedures: None ordered.   Follow-Up: At [facility], you and your health needs are our priority.  As part of our continuing mission to provide you with exceptional heart care, our providers are all part of one team.  This team includes your primary Cardiologist (physician) and Advanced Practice Providers or APPs (Physician Assistants and Nurse Practitioners) who all work together to provide you with the care you need, when you need it.  Your next appointment:   6 months with Dr Mealor

## 2023-09-24 NOTE — Progress Notes (Signed)
  Electrophysiology Office Note:    Date:  09/24/2023   ID:  Kimberly Lamb, DOB 19-Apr-1951, MRN [id number]  PCP:  Susana Enter, FNP   [facility] Providers Cardiologist:  Olinda Bertrand, DO Electrophysiologist:  Efraim Grange, MD     Referring MD: Susana Enter, FNP   History of Present Illness:    Kimberly Lamb is a 73 y.o. female with a hx listed below, significant for nonobstructive coronary disease, hypertension, hyperlipidemia, paroxysmal atrial fibrillation who presents for hospital follow-up.  She has a history of atrial fibrillation managed with flecainide .  She began to have breakthrough episodes of atrial fibrillation and was hospitalized in May with atrial fibrillation and rapid ventricular rate.  Flecainide  was discontinued and amiodarone  started.  Because she had history of torn rotator cuff during the DC cardioversion, we opted to discharge her in atrial fibrillation to continue her amiodarone  load.  She has since converted to sinus rhythm and feels better.  She presents today for follow-up prior to atrial fibrillation ablation which scheduled for December 5.  She reports that she has not had any changes in her condition, diagnoses, or medications.  She experiences A-fib symptoms for a little while at least daily.  She is now under the care of Dr. Micael Adas for sleep apnea.  She will underwent A-fib ablation with PFA December 2024.  She monitors her rhythm with an Apple watch.  She has had just a few possible AF episodes detected, but she is unable to check ECGs.   EKGs/Labs/Other Studies Reviewed Today:    Echocardiogram:  TEE 04/2022 EF 50-55%, LA moderately dilated   Monitors:   Stress testing:   Advanced imaging:   Cardiac catherization   EKG:  Last EKG results: today sinus bradycardia, 57 bpm   Recent Labs: 02/25/2023: BUN 21; Creatinine, Ser 0.82; Hemoglobin 11.9; Platelets 272; Potassium 4.4; Sodium 142     Physical Exam:    VS:   There were no vitals taken for this visit.    Wt Readings from Last 3 Encounters:  07/26/23 231 lb (104.8 kg)  06/25/23 222 lb 9.6 oz (101 kg)  04/27/23 234 lb (106.1 kg)     GEN: Well nourished, well developed in no acute distress CARDIAC: RRR, no murmurs, rubs, gallops RESPIRATORY:  Normal work of breathing MUSCULOSKELETAL: no edema    ASSESSMENT & PLAN:    Persistent atrial fibrillation Failed flecainide  Symptomatic S/p AF ablation 03/25/2023 No symptoms of AF recurrence. Apple watch has alerted her to a few possible AF episodes, but she cannot take ECGs  Continue eliquis  5mg  BID  Obstructive Sleep apnea I commended her on her compliance with CPAP She requests to transfer to [facility] for OSA management  Obesity We discussed the importance of weight loss for maintenance of sinus rhythm and to improve the safety of the procedure.  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 .  PFO Noted on CT and TEE          Medication Adjustments/Labs and Tests Ordered: Current medicines are reviewed at length with the patient today.  Concerns regarding medicines are outlined above.  No orders of the defined types were placed in this encounter.  No orders of the defined types were placed in this encounter.    Signed, Efraim Grange, MD  09/24/2023 12:36 PM     [facility]

## 2023-11-02 ENCOUNTER — Encounter: Payer: Self-pay | Admitting: Cardiology

## 2023-11-02 ENCOUNTER — Ambulatory Visit: Attending: Cardiology | Admitting: Cardiology

## 2023-11-02 ENCOUNTER — Telehealth: Payer: Self-pay | Admitting: Radiology

## 2023-11-02 VITALS — BP 134/70 | HR 62 | Ht 66.0 in | Wt 231.8 lb

## 2023-11-02 DIAGNOSIS — I10 Essential (primary) hypertension: Secondary | ICD-10-CM | POA: Diagnosis not present

## 2023-11-02 DIAGNOSIS — G4733 Obstructive sleep apnea (adult) (pediatric): Secondary | ICD-10-CM | POA: Insufficient documentation

## 2023-11-02 NOTE — Telephone Encounter (Signed)
 Patient agreement reviewed and signed on 11/02/2023.  WatchPAT issued to patient on 11/02/2023 by Woodie LOISE Prudent. Patient aware to not open the WatchPAT box until contacted with the activation PIN. Patient profile initialized in CloudPAT on 11/02/2023 by Rockie RAMAN. Device serial number: [id number]  Please list Reason for Call as Advice Only and type WatchPAT issued to patient in the comment box.

## 2023-11-02 NOTE — Progress Notes (Signed)
 Sleep Medicine Note    Date:  11/02/2023   ID:  Kimberly Lamb, DOB 09-13-1950, MRN [id number]  PCP:  Paola Greig CROME, FNP  Cardiologist: Madonna Large, DO   Chief Complaint  Patient presents with   Sleep Apnea   Hypertension    History of Present Illness:  Kimberly Lamb is a 73 y.o. female with a history of coronary artery calcifications, hyperlipidemia, hypertension and atrial fibrillation. She also has a history of sleep apnea and has been on CPAP therapy.  Apparently was being followed with [facility] physicians sleep clinic but was very frustrated with them as she felt like she was having issues that were not being addressed.  She was referred here for sleep medicine  to establish sleep care and treatment of obstructive sleep apnea.  She had a sleep study done years ago and was started on CPAP.  She had been using an  under the nose FFM but did not like it because it made her breathe through her mouth and she normally breaths through her nose.  When she would wake up in the morning her mask was on the side of her face.  She asked for a nasal mask but [facility] told her they did not have any.  She tried the [facility] that came over the bridge of her nose but it would leave marks on her face.  At her office visit 04/2023 I changed her to a nasal mask with a chinstrap.  At her last office visit she was still struggling to find a mask that she would tolerate.  She was seen by [facility] in [place] and they recommended a ResMed nasal mask but unfortunately was having problems getting it from [place].  she is doing well with her PAP device and thinks that she has gotten used to it.  She tolerates the nasal cushion mask and feels the pressure is adequate.  She does not use a chin strap. Since going on PAP she feels rested in the am after she gets up and moves around in the am and has no significant daytime sleepiness.  She denies any significant nasal dryness or nasal congestion.  She does get dry mouth but says  that she runs out of water in her water chamber. She does not think that she snores.  She would like to get on Zepbound and try to get it covered by insurance due to OSA>   Past Medical History:  Diagnosis Date   Anxiety    Arrhythmia    Coronary artery calcification    Heart murmur    Hyperlipidemia    Hypertension    Nonobstructive atherosclerosis of coronary artery    OSA on CPAP     Past Surgical History:  Procedure Laterality Date   ACHILLES TENDON SURGERY  2010   ATRIAL FIBRILLATION ABLATION N/A 03/25/2023   Procedure: ATRIAL FIBRILLATION ABLATION;  Surgeon: Nancey Eulas BRAVO, MD;  Location: [facility]AS[facility];  Service: Cardiovascular;  Laterality: N/A;   BUBBLE STUDY  04/22/2022   Procedure: BUBBLE STUDY;  Surgeon: Large Madonna, DO;  Location: [facility] ENDOSCOPY;  Service: Cardiovascular;;   CARDIOVERSION N/A 04/22/2022   Procedure: CARDIOVERSION;  Surgeon: Large Madonna, DO;  Location: [facility] ENDOS[facility];  Service: Cardiovascular;  Laterality: N/A;   knee replacement Right 2009   x3 2009, 2012, 2014   ROTATOR CUFF REPAIR  2011   Right   SPINAL FUSION     l3   TEE WITHOUT CARDIOVERSION N/A 04/22/2022  Procedure: TRANSESOPHAGEAL ECHOCARDIOGRAM (TEE);  Surgeon: Michele Richardson, DO;  Location: [facility] ENDOSCOPY;  Service: Cardiovascular;  Laterality: N/A;   VAGINAL HYSTERECTOMY  1986    Current Medications: Current Meds  Medication Sig   allopurinol (ZYLOPRIM) 100 MG tablet Take 100 mg by mouth daily.   apixaban  (ELIQUIS ) 5 MG TABS tablet TAKE 1 TABLET BY MOUTH 2 TIMES DAILY   dicyclomine (BENTYL) 20 MG tablet Take 20 mg by mouth 2 (two) times daily.   ezetimibe (ZETIA) 10 MG tablet Take 10 mg by mouth at bedtime.   ferrous sulfate 325 (65 FE) MG tablet Take 325 mg by mouth daily.   furosemide  (LASIX ) 20 MG tablet Take 0.5 tablets (10 mg total) by mouth daily as needed (leg swelling).   hydrochlorothiazide  (HYDRODIURIL ) 25 MG tablet Take 1 tablet (25 mg total) by mouth in the morning.    lisinopril  (ZESTRIL ) 40 MG tablet Take 1 tablet (40 mg total) by mouth daily.   Melatonin 10 MG CAPS Take 10-20 mg by mouth at bedtime.   metoprolol  tartrate (LOPRESSOR ) 25 MG tablet Take 12.5 mg by mouth 2 (two) times daily.   nitroGLYCERIN  (NITROSTAT ) 0.4 MG SL tablet Place 0.4 mg under the tongue every 5 (five) minutes x 3 doses as needed for chest pain (if no relief after 2nd dose, proceed to ED or call 911).   pantoprazole  (PROTONIX ) 40 MG tablet Take 40 mg by mouth 2 (two) times daily.   PARoxetine  (PAXIL ) 30 MG tablet Take 30 mg by mouth at bedtime.   Potassium 99 MG TABS Take 99 mg by mouth at bedtime.   traZODone (DESYREL) 100 MG tablet Take 50 mg by mouth at bedtime as needed for sleep.    Allergies:   Patient has no known allergies.   Social History   Socioeconomic History   Marital status: Widowed    Spouse name: Not on file   Number of children: 2   Years of education: Not on file   Highest education level: Not on file  Occupational History   Not on file  Tobacco Use   Smoking status: Never   Smokeless tobacco: Never  Vaping Use   Vaping status: Never Used  Substance and Sexual Activity   Alcohol use: Never   Drug use: Never   Sexual activity: Not Currently  Other Topics Concern   Not on file  Social History Narrative   Not on file   Social Drivers of Health   Financial Resource Strain: Not on file  Food Insecurity: Not on file  Transportation Needs: Not on file  Physical Activity: Not on file  Stress: Not on file  Social Connections: Not on file     Family History:  The patient's family history includes Heart attack in her father; Heart failure in her father.   ROS:   Please see the history of present illness.    ROS All other systems reviewed and are negative.      No data to display             PHYSICAL EXAM:   VS:  BP 134/70   Pulse 62   Ht 5' 6 (1.676 m)   Wt 231 lb 12.8 oz (105.1 kg)   SpO2 94%   BMI 37.41 kg/m    GEN: Well  nourished, well developed in no acute distress HEENT: Normal NECK: No JVD; No carotid bruits LYMPHATICS: No lymphadenopathy CARDIAC:RRR, no murmurs, rubs, gallops RESPIRATORY:  Clear to auscultation without rales, wheezing or  rhonchi  ABDOMEN: Soft, non-tender, non-distended MUSCULOSKELETAL:  No edema; No deformity  SKIN: Warm and dry NEUROLOGIC:  Alert and oriented x 3 PSYCHIATRIC:  Normal affect  Wt Readings from Last 3 Encounters:  11/02/23 231 lb 12.8 oz (105.1 kg)  09/24/23 229 lb 3.2 oz (104 kg)  07/26/23 231 lb (104.8 kg)      Studies/Labs Reviewed:   none  Recent Labs: 02/25/2023: BUN 21; Creatinine, Ser 0.82; Hemoglobin 11.9; Platelets 272; Potassium 4.4; Sodium 142    CHA2DS2-VASc Score = 4   This indicates a 4.8% annual risk of stroke. The patient's score is based upon: CHF History: 0 HTN History: 1 Diabetes History: 0 Stroke History: 0 Vascular Disease History: 1 Age Score: 1 Gender Score: 1  ASSESSMENT:    1. OSA (obstructive sleep apnea)   2. Benign hypertension   3. Morbid obesity (HCC)     PLAN:  In order of problems listed above:  OSA - The patient is tolerating PAP therapy well without any problems. The PAP download performed by his DME was personally reviewed and interpreted by me today and showed an AHI of 9.4 /hr on auto CPAP from 5-20 cm H2O with 100 % compliance in using more than 4 hours nightly.  The patient has been using and benefiting from PAP use and will continue to benefit from therapy.  -her mouth is dry in the am and she does not use a chin strap with her nasal mask so I suspect she is breathing through her mouth at times and AHI is increased due to this.  -I have encouraged her to use the chin strap -encouraged her to place a humidifier in her bedroom to help with not running out of H2O in the tank - repeat download in 4 weeks  Hypertension - BP controlled on exam today - Continue HCTZ 25 mg daily, lisinopril  40 mg daily,  Lopressor  12.5 mg twice daily with as needed refills - I have personally reviewed and interpreted outside labs performed by patient's PCP which showed SCr 0.82 potassium 4.4 on 02/25/2023  Morbid Obesity -her BMI is 37 and she has OSA -she would like to go on Zepbound and have insurance pay for it due to OSA -she will need a HST to document at least moderate OSA to get insurance to pay for GLP-1  Followup with me 1 year   Time Spent: 20 minutes total time of encounter, including 15 minutes spent in face-to-face patient care on the date of this encounter. This time includes coordination of care and counseling regarding above mentioned problem list. Remainder of non-face-to-face time involved reviewing chart documents/testing relevant to the patient encounter and documentation in the medical record. I have independently reviewed documentation from referring provider  Medication Adjustments/Labs and Tests Ordered: Current medicines are reviewed at length with the patient today.  Concerns regarding medicines are outlined above.  Medication changes, Labs and Tests ordered today are listed in the Patient Instructions below.  There are no Patient Instructions on file for this visit.   Signed, Kimberly Bihari, MD  11/02/2023 3:52 PM    [facility] [address] [place], [place], [place]  [address] Phone: [phone]; Fax: [phone]

## 2023-11-02 NOTE — Addendum Note (Signed)
 Addended by: JANIT GENI CROME on: 11/02/2023 04:01 PM   Modules accepted: Orders

## 2023-11-02 NOTE — Patient Instructions (Signed)
 Medication Instructions:  Your physician recommends that you continue on your current medications as directed. Please refer to the Current Medication list given to you today.  *If you need a refill on your cardiac medications before your next appointment, please call your pharmacy*  Lab Work: None.  If you have labs (blood work) drawn today and your tests are completely normal, you will receive your results only by: MyChart Message (if you have MyChart) OR A paper copy in the mail If you have any lab test that is abnormal or we need to change your treatment, we will call you to review the results.  Testing/Procedures: Your physician has recommended that you have a home sleep study while OFF your cpap device. This test records several body functions during sleep, including: brain activity, eye movement, oxygen and carbon dioxide blood levels, heart rate and rhythm, breathing rate and rhythm, the flow of air through your mouth and nose, snoring, body muscle movements, and chest and belly movement.   Follow-Up: At [facility], you and your health needs are our priority.  As part of our continuing mission to provide you with exceptional heart care, our providers are all part of one team.  This team includes your primary Cardiologist (physician) and Advanced Practice Providers or APPs (Physician Assistants and Nurse Practitioners) who all work together to provide you with the care you need, when you need it.  Your next appointment:   1 year(s)  Provider:   Dr. Wilbert Bihari, MD

## 2023-12-01 ENCOUNTER — Encounter: Payer: Self-pay | Admitting: Cardiology

## 2023-12-02 NOTE — Telephone Encounter (Signed)
**Note De-Identified Cathryn Gallery Obfuscation** Ordering provider: Dr Shlomo Associated diagnoses: OSA-G47.33 and Somnolence-R40.0  WatchPAT PA obtained on 12/02/2023 by Jerusalem Brownstein, Avelina HERO, LPN. Authorization: Per the traditional Medicare Part A and B website, a PA is not required for CPT Code: 95800-Itamar-HST.  Patient notified of PIN ([id number]) on 12/02/2023 Jewelle Whitner Notification Method: MyChart message.  Phone note routed to covering staff for follow-up.

## 2023-12-19 ENCOUNTER — Other Ambulatory Visit: Payer: Self-pay | Admitting: Cardiology

## 2023-12-24 ENCOUNTER — Encounter (HOSPITAL_BASED_OUTPATIENT_CLINIC_OR_DEPARTMENT_OTHER): Payer: Self-pay | Admitting: Cardiology

## 2023-12-24 DIAGNOSIS — G4733 Obstructive sleep apnea (adult) (pediatric): Secondary | ICD-10-CM | POA: Diagnosis not present

## 2023-12-25 NOTE — Procedures (Unsigned)
   SLEEP STUDY REPORT Patient Information Study Date: 12/24/2023 Patient Name: Kimberly Lamb Patient ID: [id number] Birth Date: 12/12/1950 Age: 73 Gender: Female BMI: 37.2 (W=231 lb, H=5' 6'') Stopbang: 5 Referring Physician: Wilbert Bihari, MD  TEST DESCRIPTION: Home sleep apnea testing was completed using the WatchPat, a Type 1 device, utilizing peripheral arterial tonometry (PAT), chest movement, actigraphy, pulse oximetry, pulse rate, body position and snore. AHI was calculated with apnea and hypopnea using valid sleep time as the denominator. RDI includes apneas, hypopneas, and RERAs. The data acquired and the scoring of sleep and all associated events were performed in accordance with the recommended standards and specifications as outlined in the AASM Manual for the Scoring of Sleep and Associated Events 2.2.0 (2015).  FINDINGS:  1. Severe Obstructive Sleep Apnea with AHI 30/hr.  2. No Central Sleep Apnea with pAHIc 5.8/hr.  3. Oxygen desaturations as low as 87%.  4. Severe snoring was present. O2 sats were < 88% for 1.2 min.  5. Total sleep time was 8 hrs and 14.1 min.  6. 19% of total sleep time was spent in REM sleep.  7. Normal sleep onset latency at 15 min.  8. Prolonged REM sleep onset latency at 108 min.  9. Total awakenings were 13. 10. Arrhythmia detection: Suggestive of possible brief atrial fibrillation lasting 6 minutes and 17 seconds. This is not diagnostic and further testing with outpatient telemetry monitoring is recommended.  DIAGNOSIS: Severe Obstructive Sleep Apnea (G47.33) Possible Atrial FIbrillation  RECOMMENDATIONS: 1. Clinical correlation of these findings is necessary. The decision to treat obstructive sleep apnea (OSA) is usually based on the presence of apnea symptoms or the presence of associated medical conditions such as Hypertension, Congestive Heart Failure, Atrial Fibrillation or Obesity. The most common symptoms of OSA are snoring, gasping  for breath while sleeping, daytime sleepiness and fatigue. 2. Initiating apnea therapy is recommended given the presence of symptoms and/or associated conditions. Recommend proceeding with one of the following:  a. Auto-CPAP therapy with a pressure range of 5-20cm H2O.  b. An oral appliance (OA) that can be obtained from certain dentists with expertise in sleep medicine. These are primarily of use in non-obese patients with mild and moderate disease.  c. An ENT consultation which may be useful to look for specific causes of obstruction and possible treatment options.  d. If patient is intolerant to PAP therapy, consider referral to ENT for evaluation for hypoglossal nerve stimulator. 3. Close follow-up is necessary to ensure success with CPAP or oral appliance therapy for maximum benefit . 4. A follow-up oximetry study on CPAP is recommended to assess the adequacy of therapy and determine the need for supplemental oxygen or the potential need for Bi-level therapy. An arterial blood gas to determine the adequacy of baseline ventilation and oxygenation should also be considered. 5. Healthy sleep recommendations include: adequate nightly sleep (normal 7-9 hrs/night), avoidance of caffeine after noon and alcohol near bedtime, and maintaining a sleep environment that is cool, dark and quiet. 6. Weight loss for overweight patients is recommended. Even modest amounts of weight loss can significantly improve the severity of sleep apnea. 7. Snoring recommendations include: weight loss where appropriate, side sleeping, and avoidance of alcohol before bed. 8. Operation of motor vehicle should be avoided when sleepy.  Signature: Wilbert Bihari, MD; [facility]; Diplomat, American Board of Sleep Medicine Electronically Signed: 12/25/2023 7:15:24 PM

## 2023-12-27 ENCOUNTER — Ambulatory Visit: Attending: Cardiology

## 2023-12-27 DIAGNOSIS — G4733 Obstructive sleep apnea (adult) (pediatric): Secondary | ICD-10-CM

## 2023-12-28 ENCOUNTER — Telehealth: Payer: Self-pay | Admitting: Cardiology

## 2023-12-28 NOTE — Telephone Encounter (Signed)
   Pre-operative Risk Assessment    Patient Name: Kimberly Lamb  DOB: [birth year] MRN: [id number]   Date of last office visit: 09/24/23 Date of next office visit: 03/24/24   Request for Surgical Clearance    Procedure:  Colonoscopy  Date of Surgery:  Clearance 02/18/24                                Surgeon:  Dr. Rexene Surgeon's Group or Practice Name:  [facility]  Phone number:  [phone]  Fax number:  [phone]   Type of Clearance Requested:   - Pharmacy:  Hold Apixaban  (Eliquis )     Type of Anesthesia:  Propofol    Additional requests/questions:  Caller [name] stated they will need to hold Eliquis  for 2 days.  Signed, Jasmin B Wilson   12/28/2023, 2:16 PM

## 2023-12-30 NOTE — Telephone Encounter (Signed)
   Patient Name: Kimberly Lamb  DOB: 1950/10/06 MRN: [id number]  Primary Cardiologist: Madonna Large, DO  Chart reviewed as part of pre-operative protocol coverage. Pre-op  clearance already addressed by colleagues in earlier phone notes. To summarize recommendations:  -Patient has not had an Afib/aflutter ablation or Watchman within the last 3 months or DCCV within the last 30 days      Per office protocol, patient can hold Eliquis  for 2 days prior to procedure.  Please resume when medically safe to do so.  Patient will not need bridging with Lovenox (enoxaparin) around procedure.  Medical clearance not requested.     Will route this bundled recommendation to requesting provider via Epic fax function and remove from pre-op  pool. Please call with questions.  Orren LOISE Fabry, PA-C 12/30/2023, 8:17 AM

## 2023-12-30 NOTE — Telephone Encounter (Signed)
 Patient with diagnosis of atrial fibrillation on Eliquis  for anticoagulation.    Procedure: colonoscopy Date of procedure: 02/18/2024   CHA2DS2-VASc Score = 4   This indicates a 4.8% annual risk of stroke. The patient's score is based upon: CHF History: 0 HTN History: 1 Diabetes History: 0 Stroke History: 0 Vascular Disease History: 1 Age Score: 1 Gender Score: 1    CrCl 98 Platelet count 232  Patient has not had an Afib/aflutter ablation or Watchman within the last 3 months or DCCV within the last 30 days    Per office protocol, patient can hold Eliquis  for 2 days prior to procedure.   Patient will not need bridging with Lovenox (enoxaparin) around procedure.  **This guidance is not considered finalized until pre-operative APP has relayed final recommendations.**

## 2024-01-05 ENCOUNTER — Telehealth: Payer: Self-pay | Admitting: *Deleted

## 2024-01-05 DIAGNOSIS — G4733 Obstructive sleep apnea (adult) (pediatric): Secondary | ICD-10-CM

## 2024-01-05 DIAGNOSIS — I4819 Other persistent atrial fibrillation: Secondary | ICD-10-CM

## 2024-01-05 NOTE — Telephone Encounter (Signed)
 The patient has been notified of the result and verbalized understanding.  All questions (if any) were answered. Joshua Dalton Seip, CMA 01/05/2024 4:06 PM    Precert titration

## 2024-01-05 NOTE — Telephone Encounter (Signed)
-----   Message from Wilbert Bihari sent at 12/25/2023  7:17 PM EDT ----- Please let patient know that they have sleep apnea.  Recommend therapeutic CPAP titration for treatment of patient's sleep disordered breathing.

## 2024-01-10 ENCOUNTER — Ambulatory Visit (HOSPITAL_BASED_OUTPATIENT_CLINIC_OR_DEPARTMENT_OTHER): Attending: Cardiology | Admitting: Cardiology

## 2024-01-10 DIAGNOSIS — G4733 Obstructive sleep apnea (adult) (pediatric): Secondary | ICD-10-CM | POA: Diagnosis not present

## 2024-01-10 DIAGNOSIS — I4819 Other persistent atrial fibrillation: Secondary | ICD-10-CM | POA: Diagnosis not present

## 2024-01-17 NOTE — Procedures (Signed)
  Indications for Polysomnography The patient is a 73 year-old Female who is 5' 6 and weighs 230.0 lbs. Her BMI equals 37.0.  A full night titration treatment study was performed.  MedicationDesyrelPotassiumPaxilProtonixLopressorMelatoninZetiaBentylEliquis Polysomnogram Data A full night polysomnogram recorded the standard physiologic parameters including EEG, EOG, EMG, EKG, nasal and oral airflow.  Respiratory parameters of chest and abdominal movements were recorded with Respiratory Inductance Plethysmography belts.   Oxygen saturation was recorded by pulse oximetry.  Sleep Architecture The total recording time of the polysomnogram was 412.6 minutes.  The total sleep time was 338.5 minutes.  The patient spent 1.5% of total sleep time in Stage N1, 83.8% in Stage N2, 14.8% in Stages N3, and 0.0% in REM.  Sleep latency was 56.9 minutes.   Sleep Efficiency was 82.0%.  Wake after Sleep Onset time was 17.0 minutes.  Titration Summary The patient was titrated at pressures ranging from 12 cm/H2O up to 17 cm/H20.The last pressure used in the study was 17 cm/H20.  Respiratory Events The polysomnogram revealed a presence of 0 obstructive, 1 central, and 0 mixed apneas resulting in an Apnea index of 0.2 events per hour.  There were 45 hypopneas (GreaterEqual to3% desaturation and/or arousal) resulting in an Apnea\Hypopnea Index (AHI  GreaterEqual to3% desaturation and/or arousal) of 8.2 events per hour.  There were 1 hypopneas (GreaterEqual to4% desaturation) resulting in an Apnea\Hypopnea Index (AHI GreaterEqual to4% desaturation) of 0.4 events per hour.  There were 14 Respiratory  Effort Related Arousals resulting in a RERA index of 2.5 events per hour. The Respiratory Disturbance Index is 10.6 events per hour.  The snore index was 0 events per hour.  Mean oxygen saturation was 92.8%.  The lowest oxygen saturation during sleep was 86.0%.  Time spent LessEqual to88% oxygen saturation was  minutes  ().  Limb Activity There were 715 limb movements recorded.  Of this total, 707 were classified as PLMs.  Of the PLMs, 473 were associated with arousals.  The Limb Movement index was 126.7 per hour while the PLM index was 125.3 per hour.  Cardiac Summary The average pulse rate was 53.0 bpm.  The minimum pulse rate was 42.0 bpm while the maximum pulse rate was 75.0 bpm.  Cardiac rhythm normal sinus rhythm with frequent PACs and PVCs    Diagnosis: Obstructive Sleep Apnea  Recommendations: 1.  Recommend a trial of ResMed CPAP at 17cm H2O with ResMed AirFit N20 nasal mask and heated humidity. 2. Close follow-up is necessary to ensure success with CPAP or oral appliance therapy for maximum benefit. 3. A follow-up oximetry study on CPAP is recommended to assess the adequacy of therapy and determine the need for supplemental oxygen or the potential need for Bi-level therapy.  An arterial blood gas to determine the adequacy of baseline ventilation and  oxygenation should also be considered. 4. Healthy sleep recommendations include:  adequate nightly sleep (normal 7-9 hrs/night), avoidance of caffeine after noon and alcohol near bedtime, and maintaining a sleep environment that is cool, dark and quiet 5. Weight loss for overweight patients is recommended.  Even modest amounts of weight loss can significantly improve the severity of sleep apnea. 6. Snoring recommendations include:  weight loss where appropriate, side sleeping, and avoidance of alcohol before bed. 7. Operation of motor vehicle should be avoided when sleepy.    This study was personally reviewed and electronically signed by: Shlomo Corning, MD Accredited Board Certified in Sleep Medicine Date/Time: 01/17/2024 3:29PM

## 2024-01-19 ENCOUNTER — Telehealth: Payer: Self-pay | Admitting: *Deleted

## 2024-01-19 DIAGNOSIS — G4733 Obstructive sleep apnea (adult) (pediatric): Secondary | ICD-10-CM

## 2024-01-19 NOTE — Telephone Encounter (Signed)
-----   Message from Wilbert Bihari sent at 01/17/2024  3:31 PM EDT ----- Please let patient know that they had a successful PAP titration and let DME know that orders are in EPIC.  Please set up 6 week OV with me.

## 2024-01-19 NOTE — Telephone Encounter (Signed)
 The patient has been notified of the result and verbalized understanding.  All questions (if any) were answered. Joshua Dalton Seip, CMA 01/19/2024 4:12 PM    Patient says she already has a cpap machine and this test was trying to get her a lower pressure or to see if her AHI number would decrease.

## 2024-01-28 NOTE — Telephone Encounter (Addendum)
 Per dr Shlomo Start auto CPAP from 17 cm H2O. Order faxed to [facility].

## 2024-02-29 ENCOUNTER — Other Ambulatory Visit: Payer: Self-pay | Admitting: Cardiology

## 2024-02-29 DIAGNOSIS — I4819 Other persistent atrial fibrillation: Secondary | ICD-10-CM

## 2024-02-29 NOTE — Telephone Encounter (Signed)
 Prescription refill request for Eliquis  received. Indication:afib Last office visit:7/25 Scr:0.75  11/24 Age: 73 Weight:104.3  kg  Prescription refilled

## 2024-02-29 NOTE — Addendum Note (Signed)
 Addended by: JOSHUA DALTON MATSU on: 02/29/2024 01:41 PM   Modules accepted: Orders

## 2024-03-07 NOTE — Telephone Encounter (Signed)
 Her AHI is mildly elevated due to mouth breathing - recommend that she use the chin strap    Patient says she will wear the cpap but she wants a small size.

## 2024-03-24 ENCOUNTER — Ambulatory Visit: Admitting: Cardiovascular Disease

## 2024-06-23 ENCOUNTER — Ambulatory Visit: Admitting: Cardiovascular Disease
